# Patient Record
Sex: Female | Born: 1990 | Race: Black or African American | Hispanic: No | Marital: Single | Smoking: Never smoker
Health system: Southern US, Community
[De-identification: ages and names within clinical notes are randomized; demographics above are authoritative.]

## PROBLEM LIST (undated history)

## (undated) DIAGNOSIS — D649 Anemia, unspecified: Secondary | ICD-10-CM

## (undated) HISTORY — DX: Anemia, unspecified: D64.9

## (undated) HISTORY — PX: NO PAST SURGERIES: SHX2092

---
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
## 2009-05-21 ENCOUNTER — Other Ambulatory Visit: Admission: RE | Admit: 2009-05-21 | Discharge: 2009-05-21 | Payer: Self-pay | Admitting: Family Medicine

## 2010-06-05 ENCOUNTER — Other Ambulatory Visit
Admission: RE | Admit: 2010-06-05 | Discharge: 2010-06-05 | Payer: Self-pay | Source: Home / Self Care | Admitting: Family Medicine

## 2010-11-29 ENCOUNTER — Inpatient Hospital Stay (INDEPENDENT_AMBULATORY_CARE_PROVIDER_SITE_OTHER)
Admission: RE | Admit: 2010-11-29 | Discharge: 2010-11-29 | Disposition: A | Discharge status: Home / Self Care | Payer: BC Managed Care – PPO | Source: Ambulatory Visit | Attending: Emergency Medicine | Admitting: Emergency Medicine

## 2010-11-29 DIAGNOSIS — J029 Acute pharyngitis, unspecified: Secondary | ICD-10-CM

## 2010-11-29 DIAGNOSIS — R509 Fever, unspecified: Secondary | ICD-10-CM

## 2011-01-19 ENCOUNTER — Other Ambulatory Visit (HOSPITAL_COMMUNITY)
Admission: RE | Admit: 2011-01-19 | Discharge: 2011-01-19 | Disposition: A | Discharge status: Home / Self Care | Payer: BC Managed Care – PPO | Source: Ambulatory Visit | Attending: Obstetrics and Gynecology | Admitting: Obstetrics and Gynecology

## 2011-01-19 DIAGNOSIS — N760 Acute vaginitis: Secondary | ICD-10-CM | POA: Insufficient documentation

## 2011-01-19 DIAGNOSIS — Z124 Encounter for screening for malignant neoplasm of cervix: Secondary | ICD-10-CM | POA: Insufficient documentation

## 2011-01-19 DIAGNOSIS — Z113 Encounter for screening for infections with a predominantly sexual mode of transmission: Secondary | ICD-10-CM | POA: Insufficient documentation

## 2012-09-09 ENCOUNTER — Emergency Department (INDEPENDENT_AMBULATORY_CARE_PROVIDER_SITE_OTHER)
Admission: EM | Admit: 2012-09-09 | Discharge: 2012-09-09 | Disposition: A | Discharge status: Home / Self Care | Payer: PRIVATE HEALTH INSURANCE | Source: Home / Self Care | Attending: Emergency Medicine | Admitting: Emergency Medicine

## 2012-09-09 ENCOUNTER — Encounter (HOSPITAL_COMMUNITY): Payer: Self-pay | Admitting: *Deleted

## 2012-09-09 DIAGNOSIS — N912 Amenorrhea, unspecified: Secondary | ICD-10-CM

## 2012-09-09 LAB — POCT URINALYSIS DIP (DEVICE)
Protein, ur: 30 mg/dL — AB
Specific Gravity, Urine: >=1.03 (ref 1.005–1.030)
pH: 6 (ref 5.0–8.0)

## 2012-09-09 NOTE — ED Notes (Signed)
Pt  Not  Having  Any  Symptoms  Other than  Being  Late  On  Her  Period      She  Wants  To  Be  Checked  For  Pregnancy

## 2012-09-09 NOTE — ED Provider Notes (Signed)
History     CSN: [id number]  Arrival date & time 09/09/12  1543   First MD Initiated Contact with Patient 09/09/12 1543      Chief Complaint  Patient presents with  . Possible Pregnancy    (Consider location/radiation/quality/duration/timing/severity/associated sxs/prior treatment) HPI Comments: Patient presents urgent care describing that she's been late for her period for 5 days. Describes that she tends to be irregular at times. Denies any nausea vomiting pelvic pain or vaginal bleeding denies any urinary symptoms. Currently his sexually active but is not using protection consistently.  The history is provided by the patient.    History reviewed. No pertinent past medical history.  History reviewed. No pertinent past surgical history.  History reviewed. No pertinent family history.  History  Substance Use Topics  . Smoking status: Not on file  . Smokeless tobacco: Not on file  . Alcohol Use: No    OB History   Grav Para Term Preterm Abortions TAB SAB Ect Mult Living                  Review of Systems  Constitutional: Negative for fever, chills, activity change and appetite change.  Gastrointestinal: Negative for abdominal pain.  Genitourinary: Positive for menstrual problem. Negative for dysuria, urgency, frequency, hematuria, flank pain, vaginal bleeding, vaginal discharge, difficulty urinating, genital sores, pelvic pain and dyspareunia.  Musculoskeletal: Negative for arthralgias.  Skin: Negative for pallor and rash.    Allergies  Review of patient's allergies indicates no known allergies.  Home Medications  No current outpatient prescriptions on file.  BP 136/82  Pulse 82  Temp(Src) 100.2 F (37.9 C) (Oral)  Resp 16  SpO2 97%  LMP 08/08/2012  Physical Exam  Constitutional: She appears well-developed and well-nourished. No distress.  Abdominal: Soft. There is no tenderness.  Skin: No rash noted. No erythema. No pallor.    ED Course  Procedures  (including critical care time)  Labs Reviewed  POCT URINALYSIS DIP (DEVICE) - Abnormal; Notable for the following:    Ketones, ur TRACE (*)    Hgb urine dipstick MODERATE (*)    Protein, ur 30 (*)    Leukocytes, UA TRACE (*)    All other components within normal limits  POCT PREGNANCY, URINE   No results found.   1. Amenorrhea       MDM   Amenorrhea- no pelvic pain. Negative pregnancy test. Advise patient to repeat test in 5-7 days if she continues with [place] in her period. Patient is basically asymptomatic at this point.      Jimmie Molly, MD 09/09/12 1710

## 2014-01-11 ENCOUNTER — Encounter (HOSPITAL_COMMUNITY): Payer: Self-pay | Admitting: Emergency Medicine

## 2014-01-11 ENCOUNTER — Emergency Department (HOSPITAL_COMMUNITY)
Admission: EM | Admit: 2014-01-11 | Discharge: 2014-01-11 | Disposition: A | Discharge status: Home / Self Care | Payer: PRIVATE HEALTH INSURANCE | Attending: Emergency Medicine | Admitting: Emergency Medicine

## 2014-01-11 DIAGNOSIS — Z3202 Encounter for pregnancy test, result negative: Secondary | ICD-10-CM | POA: Insufficient documentation

## 2014-01-11 DIAGNOSIS — N760 Acute vaginitis: Secondary | ICD-10-CM | POA: Insufficient documentation

## 2014-01-11 DIAGNOSIS — R109 Unspecified abdominal pain: Secondary | ICD-10-CM | POA: Insufficient documentation

## 2014-01-11 DIAGNOSIS — B9689 Other specified bacterial agents as the cause of diseases classified elsewhere: Secondary | ICD-10-CM | POA: Insufficient documentation

## 2014-01-11 DIAGNOSIS — A499 Bacterial infection, unspecified: Secondary | ICD-10-CM | POA: Insufficient documentation

## 2014-01-11 LAB — URINE MICROSCOPIC-ADD ON

## 2014-01-11 LAB — URINALYSIS, ROUTINE W REFLEX MICROSCOPIC
Bilirubin Urine: NEGATIVE
Glucose, UA: NEGATIVE mg/dL
KETONES UR: NEGATIVE mg/dL
LEUKOCYTES UA: NEGATIVE
NITRITE: NEGATIVE
PROTEIN: NEGATIVE mg/dL
Specific Gravity, Urine: 1.024 (ref 1.005–1.030)
UROBILINOGEN UA: 1 mg/dL (ref 0.0–1.0)
pH: 6 (ref 5.0–8.0)

## 2014-01-11 LAB — PREGNANCY, URINE: Preg Test, Ur: NEGATIVE

## 2014-01-11 LAB — WET PREP, GENITAL
TRICH WET PREP: NONE SEEN
Yeast Wet Prep HPF POC: NONE SEEN

## 2014-01-11 MED ORDER — METRONIDAZOLE 500 MG PO TABS: 500.0000 mg | ORAL_TABLET | Freq: Two times a day (BID) | ORAL | Status: DC

## 2014-01-11 NOTE — ED Notes (Signed)
Pt in c/o lower abd pain and frequent urination over the last few days, denies fever, denies other symptoms. No distress noted.

## 2014-01-11 NOTE — Discharge Instructions (Signed)
Bacterial Vaginosis Bacterial vaginosis is a vaginal infection that occurs when the normal balance of bacteria in the vagina is disrupted. It results from an overgrowth of certain bacteria. This is the most common vaginal infection in women of childbearing age. Treatment is important to prevent complications, especially in pregnant women, as it can cause a premature delivery. CAUSES  Bacterial vaginosis is caused by an increase in harmful bacteria that are normally present in smaller amounts in the vagina. Several different kinds of bacteria can cause bacterial vaginosis. However, the reason that the condition develops is not fully understood. RISK FACTORS Certain activities or behaviors can put you at an increased risk of developing bacterial vaginosis, including:  Having a new sex partner or multiple sex partners.  Douching.  Using an intrauterine device (IUD) for contraception. Women do not get bacterial vaginosis from toilet seats, bedding, swimming pools, or contact with objects around them. SIGNS AND SYMPTOMS  Some women with bacterial vaginosis have no signs or symptoms. Common symptoms include:  Grey vaginal discharge.  A fishlike odor with discharge, especially after sexual intercourse.  Itching or burning of the vagina and vulva.  Burning or pain with urination. DIAGNOSIS  Your health care provider will take a medical history and examine the vagina for signs of bacterial vaginosis. A sample of vaginal fluid may be taken. Your health care provider will look at this sample under a microscope to check for bacteria and abnormal cells. A vaginal pH test may also be done.  TREATMENT  Bacterial vaginosis may be treated with antibiotic medicines. These may be given in the form of a pill or a vaginal cream. A second round of antibiotics may be prescribed if the condition comes back after treatment.  HOME CARE INSTRUCTIONS   Only take over-the-counter or prescription medicines as  directed by your health care provider.  If antibiotic medicine was prescribed, take it as directed. Make sure you finish it even if you start to feel better.  Do not have sex until treatment is completed.  Tell all sexual partners that you have a vaginal infection. They should see their health care provider and be treated if they have problems, such as a mild rash or itching.  Practice safe sex by using condoms and only having one sex partner. SEEK MEDICAL CARE IF:   Your symptoms are not improving after 3 days of treatment.  You have increased discharge or pain.  You have a fever. MAKE SURE YOU:   Understand these instructions.  Will watch your condition.  Will get help right away if you are not doing well or get worse. FOR MORE INFORMATION  Centers for Disease Control and Prevention, Division of STD Prevention: www.cdc.gov/std American Sexual Health Association (ASHA): www.ashastd.org  Document Released: 06/07/2005 Document Revised: 03/28/2013 Document Reviewed: 01/17/2013 ExitCare Patient Information 2015 [employer]. This information is not intended to replace advice given to you by your health care provider. Make sure you discuss any questions you have with your health care provider.  

## 2014-01-11 NOTE — ED Provider Notes (Signed)
CSN: [id number]     Arrival date & time 01/11/14  1602 History   First MD Initiated Contact with Patient 01/11/14 1917     Chief Complaint  Patient presents with  . Urinary Frequency  . Abdominal Pain     (Consider location/radiation/quality/duration/timing/severity/associated sxs/prior Treatment) HPI Comments: Patient presents to the emergency department with chief complaint of urinary frequency, as well as lower abdominal discomfort. She denies any fevers, chills, nausea, vomiting, diarrhea, or constipation. Denies any vaginal discharge. She is tried taking ibuprofen with some relief. She denies any dysuria, or hematuria. She states it is possible she could be pregnant.  The history is provided by the patient. No language interpreter was used.    History reviewed. No pertinent past medical history. History reviewed. No pertinent past surgical history. History reviewed. No pertinent family history. History  Substance Use Topics  . Smoking status: Not on file  . Smokeless tobacco: Not on file  . Alcohol Use: No   OB History   Grav Para Term Preterm Abortions TAB SAB Ect Mult Living                 Review of Systems  Gastrointestinal: Positive for abdominal pain.  Genitourinary: Positive for frequency. Negative for dysuria and hematuria.  All other systems reviewed and are negative.     Allergies  Review of patient's allergies indicates no known allergies.  Home Medications   Prior to Admission medications   Not on File   BP 135/82  Pulse 83  Temp(Src) 98.6 F (37 C) (Oral)  Resp 18  SpO2 99% Physical Exam  Nursing note and vitals reviewed. Constitutional: She is oriented to person, place, and time. She appears well-developed and well-nourished.  HENT:  Head: Normocephalic and atraumatic.  Eyes: Conjunctivae and EOM are normal. Pupils are equal, round, and reactive to light.  Neck: Normal range of motion. Neck supple.  Cardiovascular: Normal rate and regular  rhythm.  Exam reveals no gallop and no friction rub.   No murmur heard. Pulmonary/Chest: Effort normal and breath sounds normal. No respiratory distress. She has no wheezes. She has no rales. She exhibits no tenderness.  Abdominal: Soft. Bowel sounds are normal. She exhibits no distension and no mass. There is no tenderness. There is no rebound and no guarding.  No focal abdominal tenderness, no RLQ tenderness or pain at McBurney's point, no RUQ tenderness or Murphy's sign, no left-sided abdominal tenderness, no fluid wave, or signs of peritonitis   Genitourinary:  Pelvic exam chaperoned by female ER tech, no right or left adnexal tenderness, no uterine tenderness, mild brown vaginal discharge, no bleeding, no CMT or friability, no foreign body, no injury to the external genitalia, no other significant findings   Musculoskeletal: Normal range of motion. She exhibits no edema and no tenderness.  Neurological: She is alert and oriented to person, place, and time.  Skin: Skin is warm and dry.  Psychiatric: She has a normal mood and affect. Her behavior is normal. Judgment and thought content normal.    ED Course  Procedures (including critical care time) Results for orders placed during the hospital encounter of 01/11/14  WET PREP, GENITAL      Result Value Ref Range   Yeast Wet Prep HPF POC NONE SEEN  NONE SEEN   Trich, Wet Prep NONE SEEN  NONE SEEN   Clue Cells Wet Prep HPF POC MANY (*) NONE SEEN   WBC, Wet Prep HPF POC FEW (*) NONE SEEN  URINALYSIS,  ROUTINE W REFLEX MICROSCOPIC      Result Value Ref Range   Color, Urine YELLOW  YELLOW   APPearance CLOUDY (*) CLEAR   Specific Gravity, Urine 1.024  1.005 - 1.030   pH 6.0  5.0 - 8.0   Glucose, UA NEGATIVE  NEGATIVE mg/dL   Hgb urine dipstick LARGE (*) NEGATIVE   Bilirubin Urine NEGATIVE  NEGATIVE   Ketones, ur NEGATIVE  NEGATIVE mg/dL   Protein, ur NEGATIVE  NEGATIVE mg/dL   Urobilinogen, UA 1.0  0.0 - 1.0 mg/dL   Nitrite NEGATIVE   NEGATIVE   Leukocytes, UA NEGATIVE  NEGATIVE  PREGNANCY, URINE      Result Value Ref Range   Preg Test, Ur NEGATIVE  NEGATIVE  URINE MICROSCOPIC-ADD ON      Result Value Ref Range   Squamous Epithelial / LPF MANY (*) RARE   WBC, UA 3-6  <3 WBC/hpf   RBC / HPF 11-20  <3 RBC/hpf   Bacteria, UA MANY (*) RARE   No results found.   Imaging Review No results found.   EKG Interpretation None      MDM   Final diagnoses:  BV (bacterial vaginosis)    Patient with urinary frequency. She is concerned about UTI. She also complains of abdominal pain, however she does not have any abdominal tenderness. Doubt acute or surgical abdomen. No fevers. No nausea, vomiting, diarrhea, constipation. No vaginal discharge. Will check a urinalysis, and reassess.  8:30 PM Doubt UTI based on urinalysis, will proceed with pelvic exam, and wet prep.  Pelvic exam was remarkable for some brown discharge, no adnexal or uterine tenderness.  10:08 PM Wet prep was remarkable for many clue cells. Will treat for BV with metronidazole. Discharge to home. Patient understands and agrees with plan. She is stable and ready for discharge.  Discussed with Dr. Ashok Cordia, who agrees with the plan.    Montine Circle, PA-C 01/11/14 2209

## 2014-01-11 NOTE — ED Provider Notes (Signed)
Medical screening examination/treatment/procedure(s) were conducted as a shared visit with non-physician practitioner(s) and myself.  I personally evaluated the patient during the encounter.  Pt c/o urinary urgency, frequency. No abd or flank pain. abd soft nt. Labs.    Mirna Mires, MD 01/11/14 [phone]

## 2014-01-14 LAB — GC/CHLAMYDIA PROBE AMP
CT PROBE, AMP APTIMA: NEGATIVE
GC PROBE AMP APTIMA: NEGATIVE

## 2016-06-10 ENCOUNTER — Emergency Department (HOSPITAL_COMMUNITY)
Admission: EM | Admit: 2016-06-10 | Discharge: 2016-06-10 | Disposition: A | Discharge status: Home / Self Care | Payer: No Typology Code available for payment source | Attending: Emergency Medicine | Admitting: Emergency Medicine

## 2016-06-10 ENCOUNTER — Encounter (HOSPITAL_COMMUNITY): Payer: Self-pay

## 2016-06-10 DIAGNOSIS — Y999 Unspecified external cause status: Secondary | ICD-10-CM | POA: Diagnosis not present

## 2016-06-10 DIAGNOSIS — Y939 Activity, unspecified: Secondary | ICD-10-CM | POA: Diagnosis not present

## 2016-06-10 DIAGNOSIS — Y92410 Unspecified street and highway as the place of occurrence of the external cause: Secondary | ICD-10-CM | POA: Diagnosis not present

## 2016-06-10 DIAGNOSIS — S86811A Strain of other muscle(s) and tendon(s) at lower leg level, right leg, initial encounter: Secondary | ICD-10-CM

## 2016-06-10 DIAGNOSIS — S8991XA Unspecified injury of right lower leg, initial encounter: Secondary | ICD-10-CM | POA: Diagnosis present

## 2016-06-10 DIAGNOSIS — S86819A Strain of other muscle(s) and tendon(s) at lower leg level, unspecified leg, initial encounter: Secondary | ICD-10-CM | POA: Diagnosis not present

## 2016-06-10 MED ORDER — METHOCARBAMOL 500 MG PO TABS: 1000.0000 mg | ORAL_TABLET | Freq: Four times a day (QID) | ORAL | 0 refills | Status: DC

## 2016-06-10 MED ORDER — NAPROXEN 500 MG PO TABS
500.0000 mg | ORAL_TABLET | Freq: Once | ORAL | Status: AC
  Administered 2016-06-10: 500 mg via ORAL
  Filled 2016-06-10: qty 1

## 2016-06-10 MED ORDER — NAPROXEN 500 MG PO TABS: 500.0000 mg | ORAL_TABLET | Freq: Two times a day (BID) | ORAL | 0 refills | Status: DC
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
## 2020-10-24 ENCOUNTER — Encounter: Payer: Self-pay | Admitting: Podiatry

## 2020-10-24 ENCOUNTER — Ambulatory Visit (INDEPENDENT_AMBULATORY_CARE_PROVIDER_SITE_OTHER): Payer: Managed Care, Other (non HMO)

## 2020-10-24 ENCOUNTER — Other Ambulatory Visit: Payer: Self-pay | Admitting: Podiatry

## 2020-10-24 ENCOUNTER — Ambulatory Visit (INDEPENDENT_AMBULATORY_CARE_PROVIDER_SITE_OTHER): Payer: Managed Care, Other (non HMO) | Admitting: Podiatry

## 2020-10-24 ENCOUNTER — Other Ambulatory Visit: Payer: Self-pay

## 2020-10-24 DIAGNOSIS — M67471 Ganglion, right ankle and foot: Secondary | ICD-10-CM

## 2020-10-24 DIAGNOSIS — M79671 Pain in right foot: Secondary | ICD-10-CM

## 2020-10-24 DIAGNOSIS — R2241 Localized swelling, mass and lump, right lower limb: Secondary | ICD-10-CM

## 2020-10-24 DIAGNOSIS — M6740 Ganglion, unspecified site: Secondary | ICD-10-CM | POA: Diagnosis not present

## 2020-10-24 NOTE — Progress Notes (Signed)
  Subjective:  Patient ID: Joanna Hull, female    DOB: 01-06-1991,  MRN: [id number]  Chief Complaint  Patient presents with  . Foot Pain    Right foot posterior heel/ankle painful lump. Pt states noticed it 4 days ago. Denies any known injuries.   30 y.o. female presents with the above complaint. History confirmed with patient. Denies known injury.  Objective:  Physical Exam: warm, good capillary refill, no trophic changes or ulcerative lesions, normal DP and PT pulses and normal sensory exam. 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  Assessment:   1. Ankle mass, right   2. Ganglion cyst    Plan:  Patient was evaluated and treated and all questions answered.  Mass right ankle, possible ganglion; unlikely partial Achilles tear -XR reviewed. 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 -If this is fluid filled will consider aspiration next visit.  Return in about 2 weeks (around 11/07/2020) for Ultrasound review.

## 2020-11-11 ENCOUNTER — Ambulatory Visit: Payer: Managed Care, Other (non HMO) | Admitting: Podiatry

## 2020-11-19 ENCOUNTER — Ambulatory Visit
Admission: RE | Admit: 2020-11-19 | Discharge: 2020-11-19 | Disposition: A | Discharge status: Home / Self Care | Payer: PRIVATE HEALTH INSURANCE | Source: Ambulatory Visit | Attending: Podiatry | Admitting: Podiatry

## 2020-11-19 DIAGNOSIS — M6740 Ganglion, unspecified site: Secondary | ICD-10-CM

## 2020-11-19 NOTE — Addendum Note (Signed)
Addended by: Hardie Pulley on: 11/19/2020 02:53 PM   Modules accepted: Orders

## 2020-11-21 ENCOUNTER — Ambulatory Visit: Payer: Managed Care, Other (non HMO) | Admitting: Podiatry

## 2020-11-25 ENCOUNTER — Ambulatory Visit (INDEPENDENT_AMBULATORY_CARE_PROVIDER_SITE_OTHER): Payer: Managed Care, Other (non HMO) | Admitting: Podiatry

## 2020-11-25 ENCOUNTER — Other Ambulatory Visit: Payer: Self-pay

## 2020-11-25 DIAGNOSIS — L720 Epidermal cyst: Secondary | ICD-10-CM

## 2020-11-25 DIAGNOSIS — D3610 Benign neoplasm of peripheral nerves and autonomic nervous system, unspecified: Secondary | ICD-10-CM

## 2020-11-25 DIAGNOSIS — R2241 Localized swelling, mass and lump, right lower limb: Secondary | ICD-10-CM

## 2020-11-25 NOTE — Progress Notes (Signed)
  Subjective:  Patient ID: Joanna Hull, female    DOB: 1991/02/01,  MRN: [id number]  Chief Complaint  Patient presents with  . Consult    [place] review only today from 11/19/20.    30 y.o. female presents with the above complaint. History confirmed with patient. Denies known injury.  Objective:  Physical Exam: warm, good capillary refill, no trophic changes or ulcerative lesions, normal DP and PT pulses and normal sensory exam. 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  EXAM: ULTRASOUND RIGHT LOWER EXTREMITY LIMITED  TECHNIQUE: Ultrasound examination of the lower extremity soft tissues was performed in the area of clinical concern.  COMPARISON:  None.  FINDINGS: Focused ultrasound of the posterior right ankle in the region of the palpable abnormality demonstrates an oval, well-circumscribed, superficial, relatively homogeneous hypoechoic mass with posterior acoustic enhancement. The mass measures approximately 8 x 9 x 7 mm. There is no significant internal vascularity.  IMPRESSION: 1. 9 mm complex mass in the region of palpable abnormality. Appearance is nonspecific. Differential considerations include epidermal inclusion cyst and peripheral nerve sheath tumor. Recommend MRI of the right ankle with and without contrast for further evaluation. Assessment:   1. Ankle mass, right   2. Epidermal inclusion cyst   3. Schwannoma    Plan:  Patient was evaluated and treated and all questions answered.  Mass right ankle, possible ganglion; unlikely partial Achilles tear -[place] reviewed with patient.  -Discussed ordering MRI to better characterize mass -Will likely need removal -MRI order previously placed, pending.  No follow-ups on file.

## 2020-12-03 ENCOUNTER — Ambulatory Visit
Admission: RE | Admit: 2020-12-03 | Discharge: 2020-12-03 | Disposition: A | Discharge status: Home / Self Care | Payer: PRIVATE HEALTH INSURANCE | Source: Ambulatory Visit | Attending: Podiatry | Admitting: Podiatry

## 2020-12-03 ENCOUNTER — Other Ambulatory Visit: Payer: Self-pay

## 2020-12-03 DIAGNOSIS — R2241 Localized swelling, mass and lump, right lower limb: Secondary | ICD-10-CM

## 2020-12-03 DIAGNOSIS — M6740 Ganglion, unspecified site: Secondary | ICD-10-CM

## 2020-12-03 MED ORDER — GADOBENATE DIMEGLUMINE 529 MG/ML IV SOLN
20.0000 mL | Freq: Once | INTRAVENOUS | Status: AC | PRN
  Administered 2020-12-03: 20 mL via INTRAVENOUS

## 2020-12-16 ENCOUNTER — Telehealth: Payer: Self-pay | Admitting: Podiatry

## 2020-12-16 NOTE — Telephone Encounter (Signed)
Attempted to call patient to go over MRI Results. No answer. VM was left. Advised patient to call for an appt and we can discuss next steps, most likely removal

## 2020-12-19 ENCOUNTER — Ambulatory Visit: Payer: Managed Care, Other (non HMO) | Admitting: Podiatry

## 2021-01-02 ENCOUNTER — Ambulatory Visit: Payer: Managed Care, Other (non HMO) | Admitting: Podiatry

## 2022-04-26 ENCOUNTER — Emergency Department (HOSPITAL_COMMUNITY)
Admission: EM | Admit: 2022-04-26 | Discharge: 2022-04-26 | Disposition: A | Discharge status: Home / Self Care | Payer: Managed Care, Other (non HMO) | Attending: Emergency Medicine | Admitting: Emergency Medicine

## 2022-04-26 ENCOUNTER — Emergency Department (HOSPITAL_COMMUNITY): Payer: Managed Care, Other (non HMO)

## 2022-04-26 ENCOUNTER — Encounter (HOSPITAL_COMMUNITY): Payer: Self-pay | Admitting: Emergency Medicine

## 2022-04-26 ENCOUNTER — Other Ambulatory Visit: Payer: Self-pay

## 2022-04-26 DIAGNOSIS — R7309 Other abnormal glucose: Secondary | ICD-10-CM | POA: Diagnosis not present

## 2022-04-26 DIAGNOSIS — D509 Iron deficiency anemia, unspecified: Secondary | ICD-10-CM | POA: Insufficient documentation

## 2022-04-26 DIAGNOSIS — N852 Hypertrophy of uterus: Secondary | ICD-10-CM | POA: Insufficient documentation

## 2022-04-26 DIAGNOSIS — R109 Unspecified abdominal pain: Secondary | ICD-10-CM | POA: Diagnosis present

## 2022-04-26 DIAGNOSIS — R1030 Lower abdominal pain, unspecified: Secondary | ICD-10-CM

## 2022-04-26 LAB — CBC WITH DIFFERENTIAL/PLATELET
Abs Immature Granulocytes: 0.01 10*3/uL (ref 0.00–0.07)
Basophils Absolute: 0.1 10*3/uL (ref 0.0–0.1)
Basophils Relative: 1 %
Eosinophils Absolute: 0.3 10*3/uL (ref 0.0–0.5)
Eosinophils Relative: 5 %
HCT: 28 % — ABNORMAL LOW (ref 36.0–46.0)
Hemoglobin: 7.3 g/dL — ABNORMAL LOW (ref 12.0–15.0)
Immature Granulocytes: 0 %
Lymphocytes Relative: 34 %
Lymphs Abs: 2.4 10*3/uL (ref 0.7–4.0)
MCH: 17.2 pg — ABNORMAL LOW (ref 26.0–34.0)
MCHC: 26.1 g/dL — ABNORMAL LOW (ref 30.0–36.0)
MCV: 66 fL — ABNORMAL LOW (ref 80.0–100.0)
Monocytes Absolute: 0.4 10*3/uL (ref 0.1–1.0)
Monocytes Relative: 6 %
Neutro Abs: 3.8 10*3/uL (ref 1.7–7.7)
Neutrophils Relative %: 54 %
Platelets: 373 10*3/uL (ref 150–400)
RBC: 4.24 MIL/uL (ref 3.87–5.11)
RDW: 21.9 % — ABNORMAL HIGH (ref 11.5–15.5)
WBC: 7 10*3/uL (ref 4.0–10.5)
nRBC: 0 % (ref 0.0–0.2)

## 2022-04-26 LAB — I-STAT BETA HCG BLOOD, ED (MC, WL, AP ONLY): I-stat hCG, quantitative: <5 m[IU]/mL (ref <5)

## 2022-04-26 LAB — URINALYSIS, ROUTINE W REFLEX MICROSCOPIC
Bacteria, UA: NONE SEEN
Bilirubin Urine: NEGATIVE
Glucose, UA: NEGATIVE mg/dL
Ketones, ur: NEGATIVE mg/dL
Leukocytes,Ua: NEGATIVE
Nitrite: NEGATIVE
Protein, ur: NEGATIVE mg/dL
Specific Gravity, Urine: >1.046 — ABNORMAL HIGH (ref 1.005–1.030)
pH: 6 (ref 5.0–8.0)

## 2022-04-26 LAB — COMPREHENSIVE METABOLIC PANEL
ALT: 20 U/L (ref 0–44)
AST: 42 U/L — ABNORMAL HIGH (ref 15–41)
Albumin: 3.7 g/dL (ref 3.5–5.0)
Alkaline Phosphatase: 54 U/L (ref 38–126)
Anion gap: 4 — ABNORMAL LOW (ref 5–15)
BUN: 9 mg/dL (ref 6–20)
CO2: 23 mmol/L (ref 22–32)
Calcium: 8.7 mg/dL — ABNORMAL LOW (ref 8.9–10.3)
Chloride: 109 mmol/L (ref 98–111)
Creatinine, Ser: 0.62 mg/dL (ref 0.44–1.00)
GFR, Estimated: >60 mL/min (ref >60)
Glucose, Bld: 109 mg/dL — ABNORMAL HIGH (ref 70–99)
Potassium: 4.2 mmol/L (ref 3.5–5.1)
Sodium: 136 mmol/L (ref 135–145)
Total Bilirubin: 0.5 mg/dL (ref 0.3–1.2)
Total Protein: 7.6 g/dL (ref 6.5–8.1)

## 2022-04-26 LAB — LIPASE, BLOOD: Lipase: 27 U/L (ref 11–51)

## 2022-04-26 MED ORDER — IOHEXOL 300 MG/ML  SOLN
100.0000 mL | Freq: Once | INTRAMUSCULAR | Status: AC | PRN
  Administered 2022-04-26: 100 mL via INTRAVENOUS

## 2022-04-26 MED ORDER — IOHEXOL 9 MG/ML PO SOLN
ORAL | Status: AC
  Administered 2022-04-26: 500 mL
  Filled 2022-04-26: qty 1000

## 2022-04-26 NOTE — Discharge Instructions (Signed)
Take acetaminophen and/or ibuprofen as needed for pain.  When you combine acetaminophen and ibuprofen, you get better pain relief and you get from taking either medication by itself.  Return if pain is getting worse and is not adequately controlled with combined ibuprofen and acetaminophen.  Please follow-up with the women's clinic to evaluate your enlarged uterus.  Please follow-up with the cancer center to see if you would benefit from iron infusions.

## 2022-04-26 NOTE — ED Provider Notes (Signed)
[facility] DEPT Provider Note   CSN: [id number] Arrival date & time: 04/26/22  0145     History  Chief Complaint  Patient presents with   Abdominal Pain   Back Pain    Joanna Hull is a 31 y.o. female.  The history is provided by the patient.  Abdominal Pain Back Pain Associated symptoms: abdominal pain   She had onset 3 days ago midline lower back pain.  She has been taking ibuprofen with only slight relief of pain.  Yesterday, she started having pain across her lower abdomen in addition to the back pain.  She denies nausea or vomiting.  She had thought she was constipated when her back pain started and she did take a dose of milk of magnesia and had a bowel movement without any change in her symptoms.  She denies any urinary urgency, frequency, tenesmus, dysuria.  She denies any vaginal discharge.  Last menses was 04/14/2022 and was normal.  She is not using any contraception.   Home Medications Prior to Admission medications   Medication Sig Start Date End Date Taking? Authorizing Provider  methocarbamol (ROBAXIN) 500 MG tablet Take 2 tablets (1,000 mg total) by mouth 4 (four) times daily. Patient not taking: Reported on 10/24/2020 06/10/16   Carlisle Cater, PA-C  metroNIDAZOLE (FLAGYL) 500 MG tablet Take 1 tablet (500 mg total) by mouth 2 (two) times daily. Patient not taking: Reported on 10/24/2020 01/11/14   Montine Circle, PA-C  naproxen (NAPROSYN) 500 MG tablet Take 1 tablet (500 mg total) by mouth 2 (two) times daily. Patient not taking: Reported on 10/24/2020 06/10/16   Carlisle Cater, PA-C      Allergies    Patient has no known allergies.    Review of Systems   Review of Systems  Gastrointestinal:  Positive for abdominal pain.  Musculoskeletal:  Positive for back pain.  All other systems reviewed and are negative.   Physical Exam Updated Vital Signs BP (!) 179/100   Pulse 79   Temp 97.6 F (36.4 C)   Resp 18   Ht '5\' 1"'$  (1.549 m)    Wt 95.3 kg   LMP 04/14/2022   SpO2 100%   BMI 39.68 kg/m  Physical Exam Vitals and nursing note reviewed.   31 year old female, resting comfortably and in no acute distress. Vital signs are significant for elevated blood pressure. Oxygen saturation is 100%, which is normal. Head is normocephalic and atraumatic. PERRLA, EOMI. Oropharynx is clear. Neck is nontender and supple without adenopathy or JVD. Back is tender in the lower lumbar spine.  There is no CVA tenderness. Lungs are clear without rales, wheezes, or rhonchi. Chest is nontender. Heart has regular rate and rhythm without murmur. Abdomen is soft, flat, across the suprapubic area.  There is no rebound or guarding. Extremities have no cyanosis or edema, full range of motion is present. Skin is warm and dry without rash. Neurologic: Mental status is normal, cranial nerves are intact, moves all extremities equally.  ED Results / Procedures / Treatments   Labs (all labs ordered are listed, but only abnormal results are displayed) Labs Reviewed  COMPREHENSIVE METABOLIC PANEL - Abnormal; Notable for the following components:      Result Value   Glucose, Bld 109 (*)    Calcium 8.7 (*)    AST 42 (*)    Anion gap 4 (*)    All other components within normal limits  CBC WITH DIFFERENTIAL/PLATELET - Abnormal; Notable for the  following components:   Hemoglobin 7.3 (*)    HCT 28.0 (*)    MCV 66.0 (*)    MCH 17.2 (*)    MCHC 26.1 (*)    RDW 21.9 (*)    All other components within normal limits  LIPASE, BLOOD  URINALYSIS, ROUTINE W REFLEX MICROSCOPIC  I-STAT BETA HCG BLOOD, ED ([facility], [facility], [facility] ONLY)    Radiology CT ABDOMEN PELVIS W CONTRAST  Result Date: 04/26/2022 CLINICAL DATA:  Lower back pain since Thursday. Left lower quadrant pain since Saturday EXAM: CT ABDOMEN AND PELVIS WITH CONTRAST TECHNIQUE: Multidetector CT imaging of the abdomen and pelvis was performed using the standard protocol following bolus administration of  intravenous contrast. RADIATION DOSE REDUCTION: This exam was performed according to the departmental dose-optimization program which includes automated exposure control, adjustment of the mA and/or kV according to patient size and/or use of iterative reconstruction technique. CONTRAST:  186m OMNIPAQUE IOHEXOL 300 MG/ML  SOLN COMPARISON:  None Available. FINDINGS: Lower chest:  No contributory findings. Hepatobiliary: No focal liver abnormality.No evidence of biliary obstruction or stone. Pancreas: Unremarkable. Spleen: Unremarkable. Adrenals/Urinary Tract: Negative adrenals. No hydronephrosis or stone. Unremarkable bladder. Stomach/Bowel:  No obstruction. No appendicitis. Vascular/Lymphatic: No acute vascular abnormality. No mass or adenopathy. Reproductive:Globular enlargement of the uterus with heterogeneous enhancement of the body, greater thickening of the ventral body although no discrete measurable mass is seen. No history of recent pregnancy. Other: No ascites or pneumoperitoneum. Musculoskeletal: No acute abnormalities. IMPRESSION: 1. No acute finding. 2. Large uterus, assuming no recent delivery favor adenomyosis. Cannot exclude fibroids given asymmetric thickening of the ventral body. Electronically Signed   By: JJorje GuildM.D.   On: 04/26/2022 05:37    Procedures Procedures    Medications Ordered in ED Medications - No data to display  ED Course/ Medical Decision Making/ A&P                           Medical Decision Making Amount and/or Complexity of Data Reviewed Labs: ordered. Radiology: ordered.  Risk Prescription drug management.   Lower back and lower abdominal pain.  Differential diagnosis includes, but is not limited to, diverticulitis, urolithiasis, pyelonephritis, abdominal aortic aneurysm.  I have ordered laboratory work-up of CBC, comprehensive metabolic panel, lipase and I have ordered a urinalysis and pregnancy test.  I have reviewed and interpreted her  laboratory test, and my interpretation is microcytic anemia consistent with iron deficiency anemia, mildly elevated random glucose level, mild elevation of AST which is not felt to be clinically significant.  I have discussed these findings with the patient, she states that she has a known history of anemia and thinks her last hemoglobin was about 6.  I cannot find any prior hemoglobin levels in the  system or in care everywhere.  CT scan shows an enlarged uterus which is felt most likely to be from adenomyosis versus fibroids.  I have independently viewed the images, and agree with the radiologist's interpretation.  Patient states that pain is reasonably well controlled at this point.  I am discharging her with referral to women's outpatient clinic for evaluation of her enlarged uterus, and referring her to the cancer center for consideration for parenteral iron therapy.  Patient has told me that she was told to take oral iron but was not able to tolerate it.  I have also reevaluated patient's abdomen, and tenderness seems to be clearly localized to the enlarged uterus.  Final Clinical  Impression(s) / ED Diagnoses Final diagnoses:  Lower abdominal pain  Enlarged uterus  Microcytic anemia  Elevated random blood glucose level    Rx / DC Orders ED Discharge Orders     None         Delora Fuel, MD 11/94/17 [phone]

## 2022-04-26 NOTE — ED Triage Notes (Signed)
Pt c/o lower back pain since Thursday and new abdominal pain that started Saturday.

## 2022-04-27 ENCOUNTER — Telehealth: Payer: Self-pay | Admitting: Hematology and Oncology

## 2022-04-27 NOTE — Telephone Encounter (Signed)
Scheduled appointment per 11/07 referral. Patient is aware of appointment date and time. Patient is aware to arrive 15 mins prior to appointment time and to bring updated insurance cards. Patient is aware of location.   

## 2022-05-18 ENCOUNTER — Inpatient Hospital Stay: Payer: Managed Care, Other (non HMO) | Attending: Hematology and Oncology | Admitting: Hematology and Oncology

## 2022-05-18 ENCOUNTER — Inpatient Hospital Stay: Payer: Managed Care, Other (non HMO)

## 2022-05-18 VITALS — BP 144/89 | HR 67 | Temp 98.1°F | Resp 16 | Ht 61.0 in | Wt 224.5 lb

## 2022-05-18 DIAGNOSIS — K219 Gastro-esophageal reflux disease without esophagitis: Secondary | ICD-10-CM | POA: Insufficient documentation

## 2022-05-18 DIAGNOSIS — D500 Iron deficiency anemia secondary to blood loss (chronic): Secondary | ICD-10-CM | POA: Diagnosis not present

## 2022-05-18 DIAGNOSIS — N920 Excessive and frequent menstruation with regular cycle: Secondary | ICD-10-CM | POA: Insufficient documentation

## 2022-05-18 DIAGNOSIS — D649 Anemia, unspecified: Secondary | ICD-10-CM | POA: Insufficient documentation

## 2022-05-18 DIAGNOSIS — R110 Nausea: Secondary | ICD-10-CM | POA: Insufficient documentation

## 2022-05-18 NOTE — Progress Notes (Unsigned)
[facility] CONSULT NOTE  Patient Care Team: Pcp, No as PCP - General  CHIEF COMPLAINTS/PURPOSE OF CONSULTATION:  Normocytic normochromic anemia.  ASSESSMENT & PLAN:  This is a very pleasant 31 year old female patient with no significant past medical history referred to hematology for evaluation and recommendations regarding severe iron deficiency anemia.  She most recently was seen in the ER and was found to have a hemoglobin of 7.3 g/dL.  She reports heavy menstruation.  She has been taking oral iron for the past 2 to 3 weeks, tolerating it okay however in the past she had severe GERD, nausea and could not tolerate it.  Physical examination today unremarkable.  She is planning a trip coming up to [place] next week and was wondering if she can receive iron infusion before traveling.   We have discussed the following details about iron infusion.  I think it is reasonable to consider IV iron given the severity of anemia.  There are several formularies of intravenous iron available in the market.  We have discussed about risk of allergic/infusion reactions including potentially life-threatening anaphylaxis with intravenous iron however these serious allergic reactions are exceedingly rare and overestimated.  In contrast to serious allergic reactions, IV iron may be associated with nonallergic infusion reactions including self-limited urticaria, palpitations, dizziness, neck and back spasm which again occur in less than 1% of the individuals and do not progress to more serious reactions.  She is agreeable to trying the iron.  This has been ordered.  She will be scheduled as soon as possible. She will return to clinic in 3 months with repeat labs.  Thank you for consulting [place] the care of this patient.  Please do not hesitate to contact us with any additional questions or concerns.  HISTORY OF PRESENTING ILLNESS:  Joanna Hull 31 y.o. female is here because of severe anemia.   This is  a very pleasant 31 year old female patient with no significant past medical history referred to hematology with severe anemia.  She was recently seen in the emergency room for the same, started on oral iron supplementation which she has been taking for the past 2 to 3 weeks.  In the past when she tried oral iron, she had severe GERD, nausea and she could not tolerate it.  She reports fatigue, severe back pain which was attributed to endometriosis.  She denies any shortness of breath on exertion, chest pain or chest pressure.  She never received any iron infusion in the past.  No family history of hematological disorders.  She attributes this anemia to heavy menstruation.  Menstrual cycles are however regular,, happen every 29 days but heavy. She is nulliparous. Rest of the pertinent 10 point ROS reviewed and negative  REVIEW OF SYSTEMS:   Constitutional: Denies fevers, chills or abnormal night sweats Eyes: Denies blurriness of vision, double vision or watery eyes Ears, nose, mouth, throat, and face: Denies mucositis or sore throat Respiratory: Denies cough, dyspnea or wheezes Cardiovascular: Denies palpitation, chest discomfort or lower extremity swelling Gastrointestinal:  Denies nausea, heartburn or change in bowel habits Skin: Denies abnormal skin rashes Lymphatics: Denies new lymphadenopathy or easy bruising Neurological:Denies numbness, tingling or new weaknesses Behavioral/Psych: Mood is stable, no new changes  All other systems were reviewed with the patient and are negative.  MEDICAL HISTORY:  No past medical history on file.  SURGICAL HISTORY: No past surgical history on file.  SOCIAL HISTORY: Social History   Socioeconomic History   Marital status: Single  Spouse name: Not on file   Number of children: Not on file   Years of education: Not on file   Highest education level: Not on file  Occupational History   Not on file  Tobacco Use   Smoking status: Never   Smokeless  tobacco: Never  Substance and Sexual Activity   Alcohol use: Never   Drug use: Never   Sexual activity: Not on file  Other Topics Concern   Not on file  Social History Narrative   Not on file   Social Determinants of Health   Financial Resource Strain: Not on file  Food Insecurity: Not on file  Transportation Needs: Not on file  Physical Activity: Not on file  Stress: Not on file  Social Connections: Not on file  Intimate Partner Violence: Not on file    FAMILY HISTORY: No family history on file.  ALLERGIES:  has No Known Allergies.  MEDICATIONS:  No current outpatient medications on file.   No current facility-administered medications for this visit.   PHYSICAL EXAMINATION: ECOG PERFORMANCE STATUS: 0 - Asymptomatic  Vitals:   05/18/22 1112  BP: (!) 144/89  Pulse: 67  Resp: 16  Temp: 98.1 F (36.7 C)  SpO2: 100%   Filed Weights   05/18/22 1112  Weight: 224 lb 8 oz (101.8 kg)    GENERAL:alert, no distress and comfortable SKIN: skin color, texture, turgor are normal, no rashes or significant lesions EYES: normal, conjunctiva are pink and non-injected, sclera clear OROPHARYNX:no exudate, no erythema and lips, buccal mucosa, and tongue normal  NECK: supple, thyroid normal size, non-tender, without nodularity LYMPH:  no palpable lymphadenopathy in the cervical, axillary  LUNGS: clear to auscultation and percussion with normal breathing effort HEART: regular rate & rhythm and no murmurs and no lower extremity edema ABDOMEN:abdomen soft, non-tender and normal bowel sounds Musculoskeletal:no cyanosis of digits and no clubbing  PSYCH: alert & oriented x 3 with fluent speech NEURO: no focal motor/sensory deficits  LABORATORY DATA:  I have reviewed the data as listed Lab Results  Component Value Date   WBC 7.0 04/26/2022   HGB 7.3 (L) 04/26/2022   HCT 28.0 (L) 04/26/2022   MCV 66.0 (L) 04/26/2022   PLT 373 04/26/2022     Chemistry      Component Value  Date/Time   NA 136 04/26/2022 0225   K 4.2 04/26/2022 0225   CL 109 04/26/2022 0225   CO2 23 04/26/2022 0225   BUN 9 04/26/2022 0225   CREATININE 0.62 04/26/2022 0225      Component Value Date/Time   CALCIUM 8.7 (L) 04/26/2022 0225   ALKPHOS 54 04/26/2022 0225   AST 42 (H) 04/26/2022 0225   ALT 20 04/26/2022 0225   BILITOT 0.5 04/26/2022 0225       RADIOGRAPHIC STUDIES: I have personally reviewed the radiological images as listed and agreed with the findings in the report. CT ABDOMEN PELVIS W CONTRAST  Result Date: 04/26/2022 CLINICAL DATA:  Lower back pain since Thursday. Left lower quadrant pain since Saturday EXAM: CT ABDOMEN AND PELVIS WITH CONTRAST TECHNIQUE: Multidetector CT imaging of the abdomen and pelvis was performed using the standard protocol following bolus administration of intravenous contrast. RADIATION DOSE REDUCTION: This exam was performed according to the departmental dose-optimization program which includes automated exposure control, adjustment of the mA and/or kV according to patient size and/or use of iterative reconstruction technique. CONTRAST:  135m OMNIPAQUE IOHEXOL 300 MG/ML  SOLN COMPARISON:  None Available. FINDINGS: Lower chest:  No contributory findings. Hepatobiliary: No focal liver abnormality.No evidence of biliary obstruction or stone. Pancreas: Unremarkable. Spleen: Unremarkable. Adrenals/Urinary Tract: Negative adrenals. No hydronephrosis or stone. Unremarkable bladder. Stomach/Bowel:  No obstruction. No appendicitis. Vascular/Lymphatic: No acute vascular abnormality. No mass or adenopathy. Reproductive:Globular enlargement of the uterus with heterogeneous enhancement of the body, greater thickening of the ventral body although no discrete measurable mass is seen. No history of recent pregnancy. Other: No ascites or pneumoperitoneum. Musculoskeletal: No acute abnormalities. IMPRESSION: 1. No acute finding. 2. Large uterus, assuming no recent delivery  favor adenomyosis. Cannot exclude fibroids given asymmetric thickening of the ventral body. Electronically Signed   By: Jorje Guild M.D.   On: 04/26/2022 05:37    All questions were answered. The patient knows to call the clinic with any problems, questions or concerns. I spent 45 minutes in the care of this patient including H and P, review of records, counseling and coordination of care.     Benay Pike, MD 05/18/2022 11:15 AM

## 2022-05-19 ENCOUNTER — Other Ambulatory Visit: Payer: Self-pay

## 2022-05-19 ENCOUNTER — Encounter: Payer: Self-pay | Admitting: Hematology and Oncology

## 2022-05-19 ENCOUNTER — Encounter: Payer: Self-pay | Admitting: Obstetrics and Gynecology

## 2022-05-20 ENCOUNTER — Telehealth: Payer: Self-pay | Admitting: Hematology and Oncology

## 2022-05-20 NOTE — Telephone Encounter (Signed)
Called patient to notify of upcoming appointment times. Left voicemail with appointment information.

## 2022-05-21 ENCOUNTER — Other Ambulatory Visit: Payer: Self-pay | Admitting: Pharmacy Technician

## 2022-05-24 ENCOUNTER — Other Ambulatory Visit: Payer: Self-pay

## 2022-05-24 ENCOUNTER — Inpatient Hospital Stay: Payer: Managed Care, Other (non HMO) | Attending: Hematology and Oncology

## 2022-05-24 VITALS — BP 156/90 | HR 68 | Temp 98.0°F | Resp 18

## 2022-05-24 DIAGNOSIS — D509 Iron deficiency anemia, unspecified: Secondary | ICD-10-CM | POA: Insufficient documentation

## 2022-05-24 DIAGNOSIS — D500 Iron deficiency anemia secondary to blood loss (chronic): Secondary | ICD-10-CM

## 2022-05-24 DIAGNOSIS — Z79899 Other long term (current) drug therapy: Secondary | ICD-10-CM | POA: Diagnosis not present

## 2022-05-24 MED ORDER — SODIUM CHLORIDE 0.9 % IV SOLN: Freq: Once | INTRAVENOUS | Status: AC

## 2022-05-24 MED ORDER — SODIUM CHLORIDE 0.9 % IV SOLN
200.0000 mg | Freq: Once | INTRAVENOUS | Status: AC
  Administered 2022-05-24: 200 mg via INTRAVENOUS
  Filled 2022-05-24: qty 200

## 2022-05-24 NOTE — Patient Instructions (Signed)

## 2022-05-26 ENCOUNTER — Inpatient Hospital Stay: Payer: Managed Care, Other (non HMO)

## 2022-05-26 ENCOUNTER — Other Ambulatory Visit: Payer: Self-pay

## 2022-05-26 VITALS — BP 144/78 | HR 82 | Temp 98.2°F | Resp 18

## 2022-05-26 DIAGNOSIS — D500 Iron deficiency anemia secondary to blood loss (chronic): Secondary | ICD-10-CM

## 2022-05-26 DIAGNOSIS — D509 Iron deficiency anemia, unspecified: Secondary | ICD-10-CM | POA: Diagnosis not present

## 2022-05-26 MED ORDER — SODIUM CHLORIDE 0.9 % IV SOLN
200.0000 mg | Freq: Once | INTRAVENOUS | Status: AC
  Administered 2022-05-26: 200 mg via INTRAVENOUS
  Filled 2022-05-26: qty 200

## 2022-05-26 MED ORDER — SODIUM CHLORIDE 0.9 % IV SOLN: Freq: Once | INTRAVENOUS | Status: AC

## 2022-05-26 NOTE — Progress Notes (Signed)
Patient declined 30 minute post observation for her iron vss upon discharge.

## 2022-05-26 NOTE — Patient Instructions (Signed)

## 2022-05-27 ENCOUNTER — Encounter: Payer: Self-pay | Admitting: Hematology and Oncology

## 2022-05-27 ENCOUNTER — Encounter: Payer: Self-pay | Admitting: Obstetrics and Gynecology

## 2022-05-28 ENCOUNTER — Inpatient Hospital Stay: Payer: Managed Care, Other (non HMO)

## 2022-05-31 ENCOUNTER — Other Ambulatory Visit: Payer: Self-pay

## 2022-05-31 ENCOUNTER — Inpatient Hospital Stay: Payer: Managed Care, Other (non HMO)

## 2022-05-31 VITALS — BP 141/80 | HR 70 | Temp 98.2°F | Resp 18

## 2022-05-31 DIAGNOSIS — D509 Iron deficiency anemia, unspecified: Secondary | ICD-10-CM | POA: Diagnosis not present

## 2022-05-31 DIAGNOSIS — D500 Iron deficiency anemia secondary to blood loss (chronic): Secondary | ICD-10-CM

## 2022-05-31 MED ORDER — SODIUM CHLORIDE 0.9 % IV SOLN: Freq: Once | INTRAVENOUS | Status: AC

## 2022-05-31 MED ORDER — SODIUM CHLORIDE 0.9 % IV SOLN
200.0000 mg | Freq: Once | INTRAVENOUS | Status: AC
  Administered 2022-05-31: 200 mg via INTRAVENOUS
  Filled 2022-05-31: qty 200

## 2022-05-31 NOTE — Patient Instructions (Signed)

## 2022-05-31 NOTE — Progress Notes (Signed)
Pt declined to be observed for 30 minutes post Venofer infusion. Pt tolerated trtmt well w/out incident. VSS at discharge.  Ambulatory to lobby.   

## 2022-06-02 ENCOUNTER — Inpatient Hospital Stay: Payer: Managed Care, Other (non HMO)

## 2022-06-02 ENCOUNTER — Other Ambulatory Visit: Payer: Self-pay

## 2022-06-02 VITALS — BP 126/84 | HR 76 | Temp 98.2°F | Resp 17

## 2022-06-02 DIAGNOSIS — D500 Iron deficiency anemia secondary to blood loss (chronic): Secondary | ICD-10-CM

## 2022-06-02 DIAGNOSIS — D509 Iron deficiency anemia, unspecified: Secondary | ICD-10-CM | POA: Diagnosis not present

## 2022-06-02 MED ORDER — SODIUM CHLORIDE 0.9 % IV SOLN
200.0000 mg | Freq: Once | INTRAVENOUS | Status: AC
  Administered 2022-06-02: 200 mg via INTRAVENOUS
  Filled 2022-06-02: qty 200

## 2022-06-02 MED ORDER — SODIUM CHLORIDE 0.9 % IV SOLN: Freq: Once | INTRAVENOUS | Status: AC

## 2022-06-04 ENCOUNTER — Inpatient Hospital Stay: Payer: Managed Care, Other (non HMO)

## 2022-06-04 ENCOUNTER — Other Ambulatory Visit: Payer: Self-pay

## 2022-06-04 VITALS — BP 143/82 | HR 97 | Temp 98.5°F | Resp 18

## 2022-06-04 DIAGNOSIS — D509 Iron deficiency anemia, unspecified: Secondary | ICD-10-CM | POA: Diagnosis not present

## 2022-06-04 DIAGNOSIS — D500 Iron deficiency anemia secondary to blood loss (chronic): Secondary | ICD-10-CM

## 2022-06-04 MED ORDER — SODIUM CHLORIDE 0.9 % IV SOLN
200.0000 mg | Freq: Once | INTRAVENOUS | Status: AC
  Administered 2022-06-04: 200 mg via INTRAVENOUS
  Filled 2022-06-04: qty 200

## 2022-06-04 MED ORDER — SODIUM CHLORIDE 0.9 % IV SOLN: Freq: Once | INTRAVENOUS | Status: AC

## 2022-06-04 NOTE — Patient Instructions (Signed)

## 2022-06-09 ENCOUNTER — Ambulatory Visit (INDEPENDENT_AMBULATORY_CARE_PROVIDER_SITE_OTHER): Payer: Managed Care, Other (non HMO) | Admitting: Family Medicine

## 2022-06-09 ENCOUNTER — Encounter: Payer: Self-pay | Admitting: Hematology and Oncology

## 2022-06-09 ENCOUNTER — Encounter: Payer: Self-pay | Admitting: Family Medicine

## 2022-06-09 ENCOUNTER — Ambulatory Visit: Payer: PRIVATE HEALTH INSURANCE | Admitting: Family Medicine

## 2022-06-09 ENCOUNTER — Encounter: Payer: Self-pay | Admitting: Obstetrics and Gynecology

## 2022-06-09 VITALS — BP 138/86 | HR 74 | Temp 98.1°F | Resp 16 | Ht 62.0 in | Wt 225.8 lb

## 2022-06-09 DIAGNOSIS — B009 Herpesviral infection, unspecified: Secondary | ICD-10-CM | POA: Insufficient documentation

## 2022-06-09 DIAGNOSIS — Z7689 Persons encountering health services in other specified circumstances: Secondary | ICD-10-CM | POA: Diagnosis not present

## 2022-06-09 DIAGNOSIS — Z6841 Body Mass Index (BMI) 40.0 and over, adult: Secondary | ICD-10-CM

## 2022-06-09 DIAGNOSIS — D500 Iron deficiency anemia secondary to blood loss (chronic): Secondary | ICD-10-CM

## 2022-06-09 NOTE — Progress Notes (Signed)
Patient is here to established care with provider today. Patient has many health concern they would like to discuss with provider today  Care gaps discuss at appointment today  

## 2022-06-09 NOTE — Progress Notes (Signed)
   New Patient Office Visit  Subjective    Patient ID: Joanna Hull, female    DOB: 1990/09/03  Age: 31 y.o. MRN: [id number]  CC:  Chief Complaint  Patient presents with   Establish Care    HPI Joanna Hull presents to establish care. Patient reports that she has had a recent iron infusion secondary to severe anemia most likely 2/2 heavy menses. She is presently with improved sx.    No outpatient encounter medications on file as of 06/09/2022.   No facility-administered encounter medications on file as of 06/09/2022.    No past medical history on file.  No past surgical history on file.  No family history on file.  Social History   Socioeconomic History   Marital status: Single    Spouse name: Not on file   Number of children: Not on file   Years of education: Not on file   Highest education level: Not on file  Occupational History   Not on file  Tobacco Use   Smoking status: Never   Smokeless tobacco: Never  Substance and Sexual Activity   Alcohol use: Never   Drug use: Never   Sexual activity: Not on file  Other Topics Concern   Not on file  Social History Narrative   Not on file   Social Determinants of Health   Financial Resource Strain: Not on file  Food Insecurity: Not on file  Transportation Needs: Not on file  Physical Activity: Not on file  Stress: Not on file  Social Connections: Not on file  Intimate Partner Violence: Not on file    Review of Systems  All other systems reviewed and are negative.       Objective    BP 138/86   Pulse 74   Temp 98.1 F (36.7 C) (Oral)   Resp 16   Ht '5\' 2"'$  (1.575 m)   Wt 225 lb 12.8 oz (102.4 kg)   SpO2 98%   BMI 41.30 kg/m   Physical Exam Vitals and nursing note reviewed.  Constitutional:      General: She is not in acute distress. Cardiovascular:     Rate and Rhythm: Normal rate and regular rhythm.  Pulmonary:     Effort: Pulmonary effort is normal.     Breath sounds: Normal breath sounds.   Abdominal:     Palpations: Abdomen is soft.     Tenderness: There is no abdominal tenderness.  Neurological:     General: No focal deficit present.     Mental Status: She is alert and oriented to person, place, and time.         Assessment & Plan:   1. Iron deficiency anemia due to chronic blood loss Management per consultant.   2. Class 3 severe obesity due to excess calories without serious comorbidity with body mass index (BMI) of 40.0 to 44.9 in adult ([place])   3. Encounter to establish care    Return in about 3 months (around 09/08/2022) for physical.   Becky Sax, MD

## 2022-06-10 IMAGING — US US EXTREM LOW*R* LIMITED
1 series · 12 of 12 positions shown · non-contrast
Comparison: None.

CLINICAL DATA: Posterior right ankle mass for the past month.

EXAM:
ULTRASOUND RIGHT LOWER EXTREMITY LIMITED
TECHNIQUE: Ultrasound examination of the lower extremity soft tissues was
performed in the area of clinical concern.

[Series 1: us extrem low*right* limited · 0.04mm/px · 12 acquisitions, 12 frames shown]
[im 1/12]
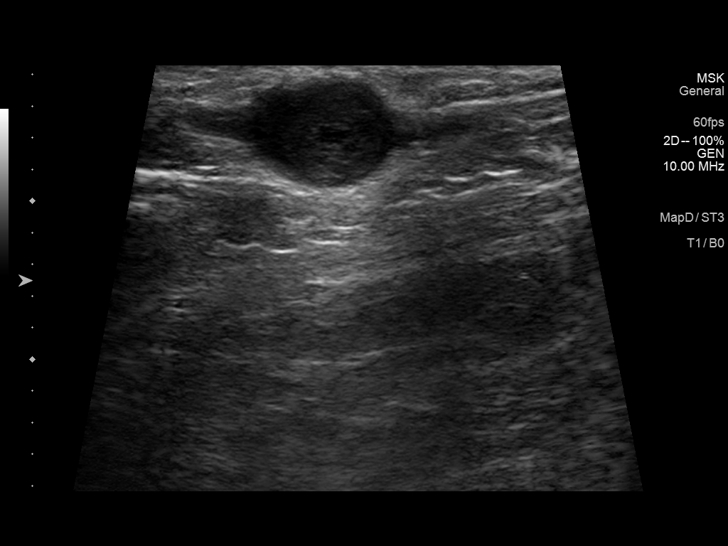
[im 2/12]
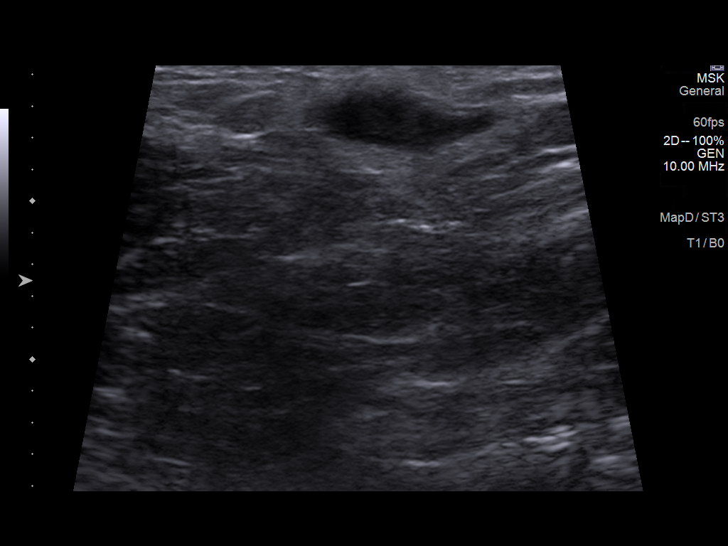
[im 3/12]
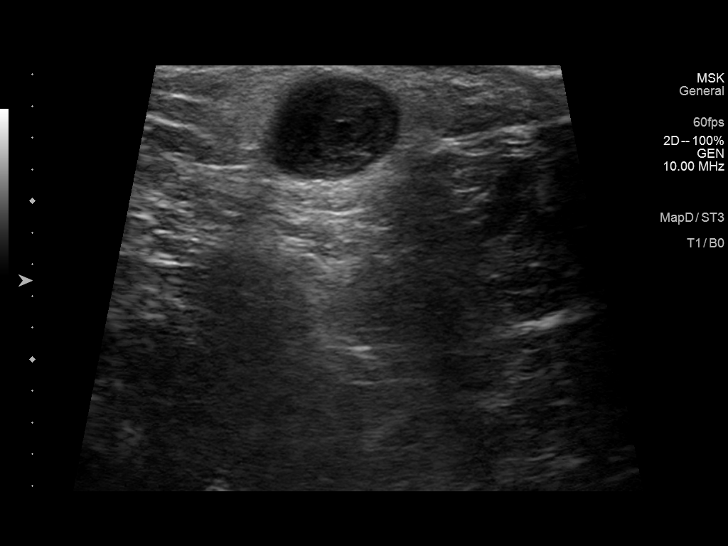
[im 4/12]
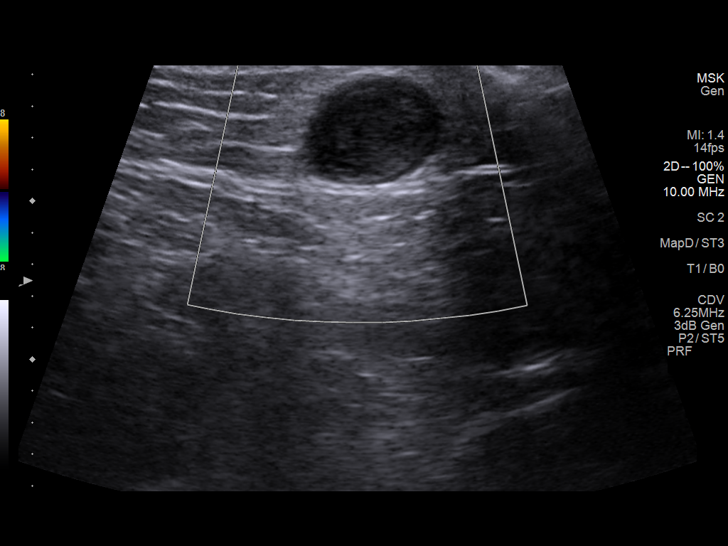
[im 5/12]
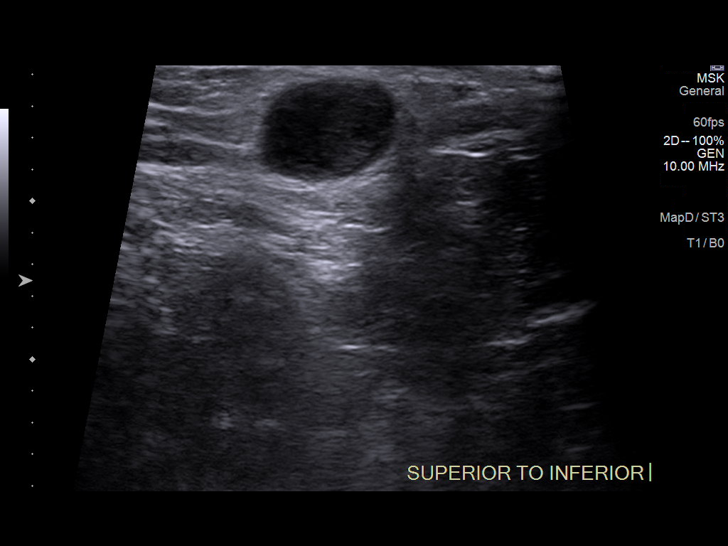
[im 6/12]
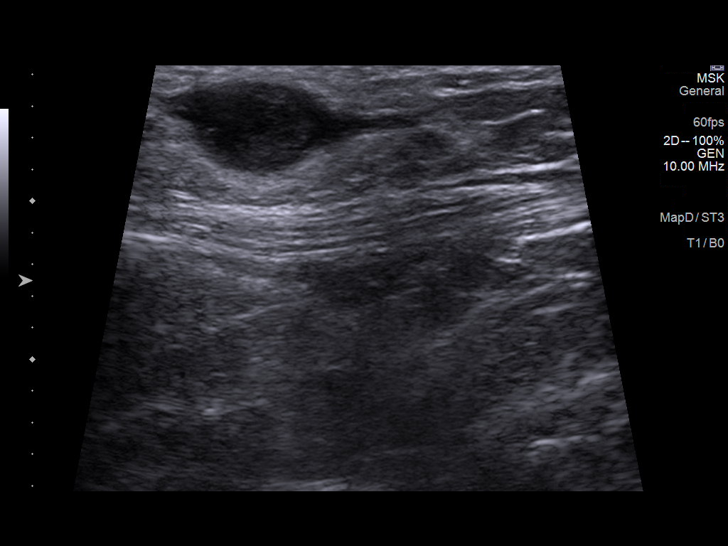
[im 7/12]
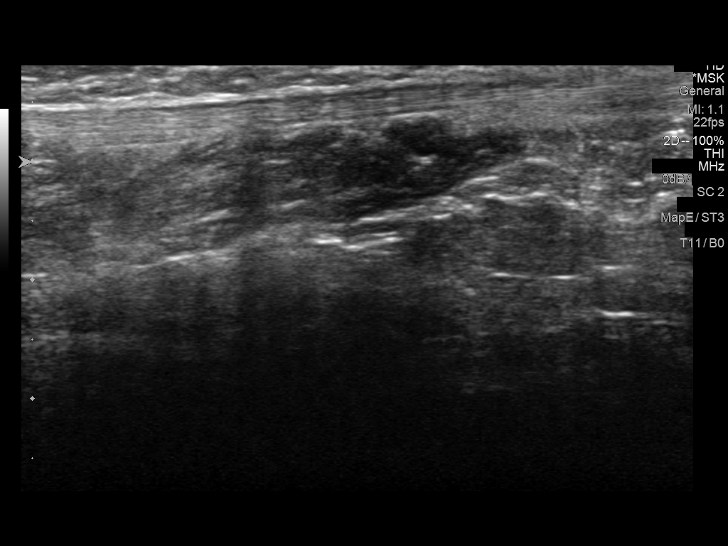
[im 8/12]
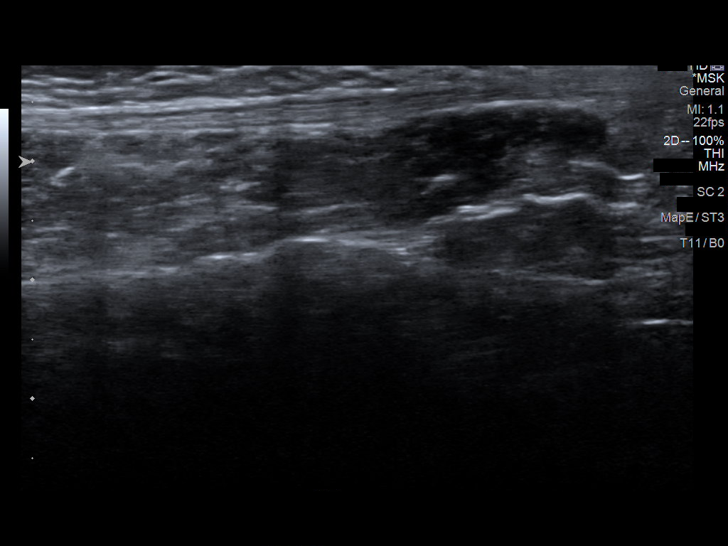
[im 9/12]
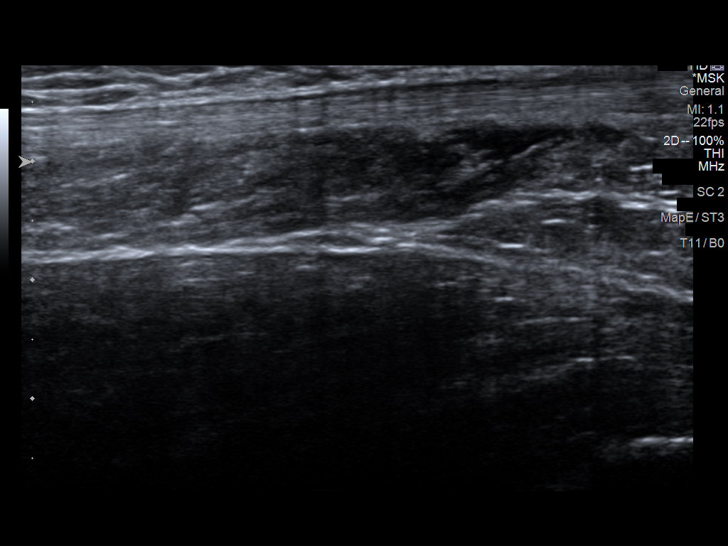
[im 10/12]
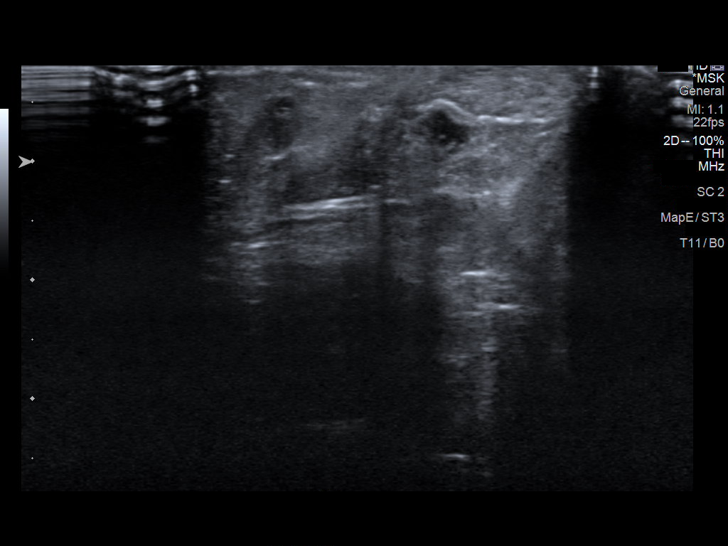
[im 11/12]
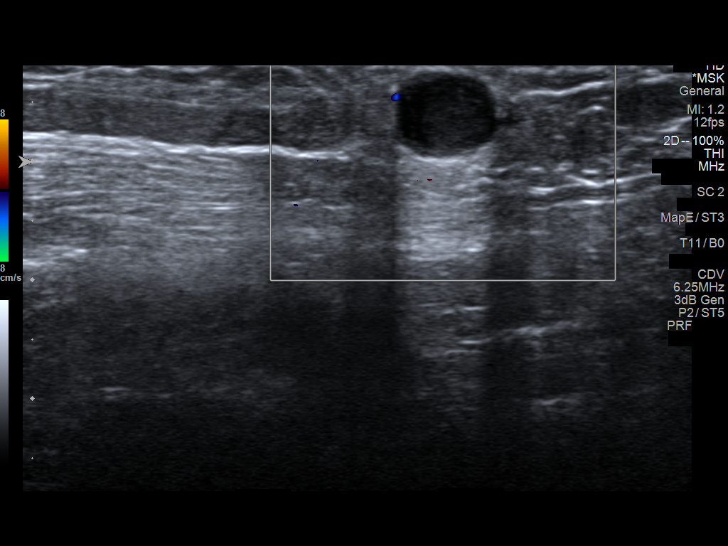
[im 12/12]
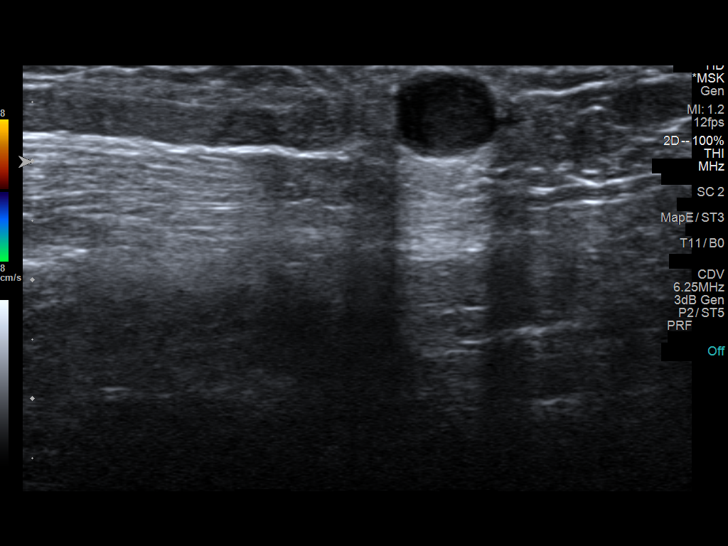

[12 of 12 positions shown; findings below may reference images not displayed]

FINDINGS: Focused ultrasound of the posterior right ankle in the region of the
palpable abnormality demonstrates an oval, well-circumscribed,
superficial, relatively homogeneous hypoechoic mass with posterior
acoustic enhancement. The mass measures approximately 8 x 9 x 7 mm.
There is no significant internal vascularity.
IMPRESSION: 1. 9 mm complex mass in the region of palpable abnormality.
Appearance is nonspecific. Differential considerations include
epidermal inclusion cyst and peripheral nerve sheath tumor.
Recommend MRI of the right ankle with and without contrast for
further evaluation.

## 2022-08-17 ENCOUNTER — Inpatient Hospital Stay (HOSPITAL_BASED_OUTPATIENT_CLINIC_OR_DEPARTMENT_OTHER): Payer: Managed Care, Other (non HMO) | Admitting: Hematology and Oncology

## 2022-08-17 ENCOUNTER — Other Ambulatory Visit: Payer: Self-pay

## 2022-08-17 ENCOUNTER — Encounter: Payer: Self-pay | Admitting: Hematology and Oncology

## 2022-08-17 ENCOUNTER — Inpatient Hospital Stay: Payer: Managed Care, Other (non HMO) | Attending: Hematology and Oncology

## 2022-08-17 VITALS — BP 134/79 | HR 72 | Temp 97.9°F | Resp 16 | Ht 62.0 in | Wt 236.7 lb

## 2022-08-17 DIAGNOSIS — N920 Excessive and frequent menstruation with regular cycle: Secondary | ICD-10-CM | POA: Diagnosis not present

## 2022-08-17 DIAGNOSIS — D500 Iron deficiency anemia secondary to blood loss (chronic): Secondary | ICD-10-CM

## 2022-08-17 DIAGNOSIS — D509 Iron deficiency anemia, unspecified: Secondary | ICD-10-CM | POA: Insufficient documentation

## 2022-08-17 DIAGNOSIS — R0602 Shortness of breath: Secondary | ICD-10-CM | POA: Insufficient documentation

## 2022-08-17 LAB — IRON AND IRON BINDING CAPACITY (CC-WL,HP ONLY)
Iron: 46 ug/dL (ref 28–170)
Saturation Ratios: 15 % (ref 10.4–31.8)
TIBC: 316 ug/dL (ref 250–450)
UIBC: 270 ug/dL (ref 148–442)

## 2022-08-17 LAB — CBC WITH DIFFERENTIAL/PLATELET
Abs Immature Granulocytes: 0 10*3/uL (ref 0.00–0.07)
Basophils Absolute: 0 10*3/uL (ref 0.0–0.1)
Basophils Relative: 1 %
Eosinophils Absolute: 0.2 10*3/uL (ref 0.0–0.5)
Eosinophils Relative: 4 %
HCT: 36.4 % (ref 36.0–46.0)
Hemoglobin: 11.7 g/dL — ABNORMAL LOW (ref 12.0–15.0)
Immature Granulocytes: 0 %
Lymphocytes Relative: 38 %
Lymphs Abs: 1.9 10*3/uL (ref 0.7–4.0)
MCH: 27.3 pg (ref 26.0–34.0)
MCHC: 32.1 g/dL (ref 30.0–36.0)
MCV: 84.8 fL (ref 80.0–100.0)
Monocytes Absolute: 0.4 10*3/uL (ref 0.1–1.0)
Monocytes Relative: 7 %
Neutro Abs: 2.6 10*3/uL (ref 1.7–7.7)
Neutrophils Relative %: 50 %
Platelets: 328 10*3/uL (ref 150–400)
RBC: 4.29 MIL/uL (ref 3.87–5.11)
RDW: 16.6 % — ABNORMAL HIGH (ref 11.5–15.5)
WBC: 5.2 10*3/uL (ref 4.0–10.5)
nRBC: 0 % (ref 0.0–0.2)

## 2022-08-17 LAB — FERRITIN: Ferritin: 11 ng/mL (ref 11–307)

## 2022-08-17 NOTE — Progress Notes (Signed)
[name], [name]  CHIEF COMPLAINTS/PURPOSE OF CONSULTATION:  Normocytic normochromic anemia.  ASSESSMENT & PLAN:  This is a very pleasant 32 year old female patient with no significant past medical history referred to hematology for evaluation and recommendations regarding severe iron deficiency anemia.   Since her last visit she received IV iron.  She certainly feels better from that shortness of breath standpoint.  She continues to feel fatigued.  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  She continues to have heavy menstrual cycles.  Physical examination today without any concerns  CBC today showed hemoglobin of 11.7 g/dL. Iron panel normal, ferritin pending. I recommended that she continue oral iron and return to clinic in about 6 months.  HISTORY OF PRESENTING ILLNESS:  Joanna Hull 32 y.o. female is here because of severe anemia.  This is a very pleasant 32 year old female patient with no significant past medical history referred to hematology with severe anemia secondary to heavy menstrual cycles.  Since her last visit here, she received IV iron which she tolerated very well.  She denies any more pica.  Shortness of breath is also significantly improved. Rest of the pertinent 10 point ROS reviewed and negative  MEDICAL HISTORY:  No past medical history on file.  SURGICAL HISTORY: No past surgical history on file.  SOCIAL HISTORY: Social History   Socioeconomic History   Marital status: Single    Spouse name: Not on file   Number of children: Not on file   Years of education: Not on file   Highest education level: Not on file  Occupational History   Not on file  Tobacco Use   Smoking status: Never   Smokeless tobacco: Never  Substance and Sexual Activity   Alcohol use: Never   Drug use: Never   Sexual activity: Not on file  Other Topics Concern   Not on file   Social History Narrative   Not on file   Social Determinants of Health   Financial Resource Strain: Not on file  Food Insecurity: Not on file  Transportation Needs: Not on file  Physical Activity: Not on file  Stress: Not on file  Social Connections: Not on file  Intimate Partner Violence: Not on file    FAMILY HISTORY: No family history on file.  ALLERGIES:  has No Known Allergies.  MEDICATIONS:  No current outpatient medications on file.   No current facility-administered medications for this visit.   PHYSICAL EXAMINATION: ECOG PERFORMANCE STATUS: 0 - Asymptomatic  Vitals:   08/17/22 1122  BP: 134/79  Pulse: 72  Resp: 16  Temp: 97.9 F (36.6 C)  SpO2: 100%   Filed Weights   08/17/22 1122  Weight: 236 lb 11.2 oz (107.4 kg)    GENERAL:alert, no distress and comfortable SKIN: skin color, texture, turgor are normal, no rashes or significant lesions EYES: normal, conjunctiva are pink and non-injected, sclera clear OROPHARYNX:no exudate, no erythema and lips, buccal mucosa, and tongue normal  NECK: supple, thyroid normal size, non-tender, without nodularity LYMPH:  no palpable lymphadenopathy in the cervical, axillary  LUNGS: clear to auscultation and percussion with normal breathing effort HEART: regular rate & rhythm and no murmurs and no lower extremity edema ABDOMEN:abdomen soft, non-tender and normal bowel sounds Musculoskeletal:no cyanosis of digits and no clubbing  PSYCH: alert & oriented x 3 with fluent speech NEURO: no focal motor/sensory deficits  LABORATORY DATA:  I have  reviewed the data as listed Lab Results  Component Value Date   WBC 5.2 08/17/2022   HGB 11.7 (L) 08/17/2022   HCT 36.4 08/17/2022   MCV 84.8 08/17/2022   PLT 328 08/17/2022     Chemistry      Component Value Date/Time   NA 136 04/26/2022 0225   K 4.2 04/26/2022 0225   CL 109 04/26/2022 0225   CO2 23 04/26/2022 0225   BUN 9 04/26/2022 0225   CREATININE 0.62 04/26/2022  0225      Component Value Date/Time   CALCIUM 8.7 (L) 04/26/2022 0225   ALKPHOS 54 04/26/2022 0225   AST 42 (H) 04/26/2022 0225   ALT 20 04/26/2022 0225   BILITOT 0.5 04/26/2022 0225      Labs from today reviewed  RADIOGRAPHIC STUDIES: I have personally reviewed the radiological images as listed and agreed with the findings in the report. No results found.  All questions were answered. The patient knows to call the clinic with any problems, questions or concerns. I spent 20 minutes in the care of this patient including H and P, review of records, counseling and coordination of care.     Benay Pike, MD 08/17/2022 11:31 AM

## 2022-08-18 ENCOUNTER — Telehealth: Payer: Self-pay | Admitting: Hematology and Oncology

## 2022-08-18 NOTE — Telephone Encounter (Signed)
Spoke with patient confirming upcoming appointment  

## 2022-09-06 ENCOUNTER — Encounter: Payer: Self-pay | Admitting: Family Medicine

## 2022-09-06 ENCOUNTER — Ambulatory Visit (INDEPENDENT_AMBULATORY_CARE_PROVIDER_SITE_OTHER): Payer: Managed Care, Other (non HMO) | Admitting: Family Medicine

## 2022-09-06 VITALS — BP 135/88 | HR 69 | Temp 98.1°F | Resp 16 | Ht 62.0 in | Wt 240.0 lb

## 2022-09-06 DIAGNOSIS — Z13220 Encounter for screening for lipoid disorders: Secondary | ICD-10-CM | POA: Diagnosis not present

## 2022-09-06 DIAGNOSIS — Z1159 Encounter for screening for other viral diseases: Secondary | ICD-10-CM | POA: Diagnosis not present

## 2022-09-06 DIAGNOSIS — E6609 Other obesity due to excess calories: Secondary | ICD-10-CM

## 2022-09-06 DIAGNOSIS — Z130 Encounter for screening for diseases of the blood and blood-forming organs and certain disorders involving the immune mechanism: Secondary | ICD-10-CM

## 2022-09-06 DIAGNOSIS — Z114 Encounter for screening for human immunodeficiency virus [HIV]: Secondary | ICD-10-CM | POA: Diagnosis not present

## 2022-09-06 DIAGNOSIS — Z0000 Encounter for general adult medical examination without abnormal findings: Secondary | ICD-10-CM | POA: Diagnosis not present

## 2022-09-06 DIAGNOSIS — Z6841 Body Mass Index (BMI) 40.0 and over, adult: Secondary | ICD-10-CM

## 2022-09-06 NOTE — Progress Notes (Unsigned)
   New Patient Office Visit  Subjective    Patient ID: Joanna Hull, female    DOB: [birth year]  Age: 32 y.o. MRN: [id number]  CC:  Chief Complaint  Patient presents with   Annual Exam    HPI Joanna Hull presents to establish care ***  No outpatient encounter medications on file as of 09/06/2022.   No facility-administered encounter medications on file as of 09/06/2022.    No past medical history on file.  No past surgical history on file.  No family history on file.  Social History   Socioeconomic History   Marital status: Single    Spouse name: Not on file   Number of children: Not on file   Years of education: Not on file   Highest education level: Not on file  Occupational History   Not on file  Tobacco Use   Smoking status: Never   Smokeless tobacco: Never  Substance and Sexual Activity   Alcohol use: Never   Drug use: Never   Sexual activity: Not on file  Other Topics Concern   Not on file  Social History Narrative   Not on file   Social Determinants of Health   Financial Resource Strain: Not on file  Food Insecurity: Not on file  Transportation Needs: Not on file  Physical Activity: Not on file  Stress: Not on file  Social Connections: Not on file  Intimate Partner Violence: Not on file    ROS      Objective    BP 135/88   Pulse 69   Temp 98.1 F (36.7 C) (Oral)   Resp 16   Ht 5\' 2"  (1.575 m)   Wt 240 lb (108.9 kg)   SpO2 97%   BMI 43.90 kg/m   Physical Exam  {Labs (Optional):23779}    Assessment & Plan:   Problem List Items Addressed This Visit   None Visit Diagnoses     Annual physical exam    -  Primary   Screening for deficiency anemia       Screening for lipid disorders           No follow-ups on file.   Becky Sax, MD

## 2022-09-07 LAB — LIPID PANEL
Chol/HDL Ratio: 5.5 ratio — ABNORMAL HIGH (ref 0.0–4.4)
Cholesterol, Total: 252 mg/dL — ABNORMAL HIGH (ref 100–199)
HDL: 46 mg/dL (ref >39)
LDL Chol Calc (NIH): 165 mg/dL — ABNORMAL HIGH (ref 0–99)
Triglycerides: 220 mg/dL — ABNORMAL HIGH (ref 0–149)
VLDL Cholesterol Cal: 41 mg/dL — ABNORMAL HIGH (ref 5–40)

## 2022-09-07 LAB — CMP14+EGFR
ALT: 15 IU/L (ref 0–32)
AST: 14 IU/L (ref 0–40)
Albumin/Globulin Ratio: 1.3 (ref 1.2–2.2)
Albumin: 3.9 g/dL (ref 3.9–4.9)
Alkaline Phosphatase: 65 IU/L (ref 44–121)
BUN/Creatinine Ratio: 16 (ref 9–23)
BUN: 11 mg/dL (ref 6–20)
Bilirubin Total: <0.2 mg/dL (ref 0.0–1.2)
CO2: 24 mmol/L (ref 20–29)
Calcium: 9.5 mg/dL (ref 8.7–10.2)
Chloride: 103 mmol/L (ref 96–106)
Creatinine, Ser: 0.69 mg/dL (ref 0.57–1.00)
Globulin, Total: 3 g/dL (ref 1.5–4.5)
Glucose: 83 mg/dL (ref 70–99)
Potassium: 4.6 mmol/L (ref 3.5–5.2)
Sodium: 139 mmol/L (ref 134–144)
Total Protein: 6.9 g/dL (ref 6.0–8.5)
eGFR: 117 mL/min/{1.73_m2} (ref >59)

## 2022-09-07 LAB — CBC WITH DIFFERENTIAL/PLATELET
Basophils Absolute: 0 10*3/uL (ref 0.0–0.2)
Basos: 1 %
EOS (ABSOLUTE): 0.2 10*3/uL (ref 0.0–0.4)
Eos: 5 %
Hematocrit: 35.3 % (ref 34.0–46.6)
Hemoglobin: 11.4 g/dL (ref 11.1–15.9)
Immature Grans (Abs): 0 10*3/uL (ref 0.0–0.1)
Immature Granulocytes: 0 %
Lymphocytes Absolute: 1.8 10*3/uL (ref 0.7–3.1)
Lymphs: 39 %
MCH: 26.7 pg (ref 26.6–33.0)
MCHC: 32.3 g/dL (ref 31.5–35.7)
MCV: 83 fL (ref 79–97)
Monocytes Absolute: 0.3 10*3/uL (ref 0.1–0.9)
Monocytes: 7 %
Neutrophils Absolute: 2.3 10*3/uL (ref 1.4–7.0)
Neutrophils: 48 %
Platelets: 338 10*3/uL (ref 150–450)
RBC: 4.27 x10E6/uL (ref 3.77–5.28)
RDW: 14.8 % (ref 11.7–15.4)
WBC: 4.7 10*3/uL (ref 3.4–10.8)

## 2022-09-07 LAB — HIV ANTIBODY (ROUTINE TESTING W REFLEX): HIV Screen 4th Generation wRfx: NONREACTIVE

## 2022-09-07 LAB — HEPATITIS C ANTIBODY: Hep C Virus Ab: NONREACTIVE

## 2022-09-08 ENCOUNTER — Encounter: Payer: Self-pay | Admitting: Family Medicine

## 2023-02-15 ENCOUNTER — Inpatient Hospital Stay: Payer: Managed Care, Other (non HMO) | Attending: Hematology and Oncology | Admitting: Hematology and Oncology

## 2023-02-15 ENCOUNTER — Inpatient Hospital Stay: Payer: Managed Care, Other (non HMO)

## 2023-02-15 VITALS — BP 137/81 | HR 76 | Temp 98.4°F | Resp 16 | Wt 242.7 lb

## 2023-02-15 DIAGNOSIS — D500 Iron deficiency anemia secondary to blood loss (chronic): Secondary | ICD-10-CM

## 2023-02-15 DIAGNOSIS — N920 Excessive and frequent menstruation with regular cycle: Secondary | ICD-10-CM | POA: Diagnosis not present

## 2023-02-15 DIAGNOSIS — Z79899 Other long term (current) drug therapy: Secondary | ICD-10-CM | POA: Insufficient documentation

## 2023-02-15 DIAGNOSIS — D509 Iron deficiency anemia, unspecified: Secondary | ICD-10-CM | POA: Diagnosis present

## 2023-02-15 LAB — CBC WITH DIFFERENTIAL/PLATELET
Abs Immature Granulocytes: 0.01 10*3/uL (ref 0.00–0.07)
Basophils Absolute: 0 10*3/uL (ref 0.0–0.1)
Basophils Relative: 0 %
Eosinophils Absolute: 0.3 10*3/uL (ref 0.0–0.5)
Eosinophils Relative: 4 %
HCT: 30.7 % — ABNORMAL LOW (ref 36.0–46.0)
Hemoglobin: 9.1 g/dL — ABNORMAL LOW (ref 12.0–15.0)
Immature Granulocytes: 0 %
Lymphocytes Relative: 34 %
Lymphs Abs: 2.3 10*3/uL (ref 0.7–4.0)
MCH: 20.3 pg — ABNORMAL LOW (ref 26.0–34.0)
MCHC: 29.6 g/dL — ABNORMAL LOW (ref 30.0–36.0)
MCV: 68.4 fL — ABNORMAL LOW (ref 80.0–100.0)
Monocytes Absolute: 0.5 10*3/uL (ref 0.1–1.0)
Monocytes Relative: 7 %
Neutro Abs: 3.6 10*3/uL (ref 1.7–7.7)
Neutrophils Relative %: 55 %
Platelets: 338 10*3/uL (ref 150–400)
RBC: 4.49 MIL/uL (ref 3.87–5.11)
RDW: 17.4 % — ABNORMAL HIGH (ref 11.5–15.5)
WBC: 6.7 10*3/uL (ref 4.0–10.5)
nRBC: 0 % (ref 0.0–0.2)

## 2023-02-15 LAB — IRON AND IRON BINDING CAPACITY (CC-WL,HP ONLY)
Iron: 21 ug/dL — ABNORMAL LOW (ref 28–170)
Saturation Ratios: 5 % — ABNORMAL LOW (ref 10.4–31.8)
TIBC: 391 ug/dL (ref 250–450)
UIBC: 370 ug/dL (ref 148–442)

## 2023-02-15 LAB — FERRITIN: Ferritin: 3 ng/mL — ABNORMAL LOW (ref 11–307)

## 2023-02-15 NOTE — Progress Notes (Signed)
[facility] CONSULT NOTE  Patient Care Team: Georganna Skeans, [name]  CHIEF COMPLAINTS/PURPOSE OF CONSULTATION:  Normocytic normochromic anemia.  ASSESSMENT & PLAN:   This is a very pleasant 32 year old female patient with no significant past medical history referred to hematology for evaluation and recommendations regarding severe iron deficiency anemia.   Since her last visit here, she continues to feel well, no symptoms concerning for worsening iron deficiency anemia.  Physical examination with no significant findings.  Labs however showed significant drop in hemoglobin, iron saturation suggest iron deficiency, ferritin pending.  She is currently taking oral iron ferrous sulfate 65/325 daily although she forgets sometimes.  If her ferritin does indicate worsening iron stores, she may have to increase the dose of ferrous sulfate to twice a day.  We can also bring her back in about 3 months for repeat labs to see if she will need another iron infusion.  HISTORY OF PRESENTING ILLNESS:  Joanna Hull 32 y.o. female is here because of severe anemia.  This is a very pleasant 32 year old female patient with no significant past medical history referred to hematology with severe anemia secondary to heavy menstrual cycles.  Since her last visit here, she has been feeling better.  She is a Engineer, civil (consulting) in the [employer].  She denies any fatigue, pica, shortness of breath, dizziness or palpitations.  She continues to have heavy menstrual cycles, no other bleeding reported. Rest of the pertinent 10 point ROS reviewed and negative  MEDICAL HISTORY:  No past medical history on file.  SURGICAL HISTORY: No past surgical history on file.  SOCIAL HISTORY: Social History   Socioeconomic History   Marital status: Single    Spouse name: Not on file   Number of children: Not on file   Years of education: Not on file   Highest education level: Not on file  Occupational  History   Not on file  Tobacco Use   Smoking status: Never   Smokeless tobacco: Never  Substance and Sexual Activity   Alcohol use: Never   Drug use: Never   Sexual activity: Not on file  Other Topics Concern   Not on file  Social History Narrative   Not on file   Social Determinants of Health   Financial Resource Strain: Not on file  Food Insecurity: Not on file  Transportation Needs: Not on file  Physical Activity: Not on file  Stress: Not on file  Social Connections: Not on file  Intimate Partner Violence: Not on file    FAMILY HISTORY: No family history on file.  ALLERGIES:  has No Known Allergies.  MEDICATIONS:  Current Outpatient Medications  Medication Sig Dispense Refill   ferrous sulfate 325 (65 FE) MG tablet Take 325 mg by mouth daily with breakfast.     No current facility-administered medications for this visit.   PHYSICAL EXAMINATION: ECOG PERFORMANCE STATUS: 0 - Asymptomatic  Vitals:   02/15/23 1144  BP: 137/81  Pulse: 76  Resp: 16  Temp: 98.4 F (36.9 C)  SpO2: 100%   Filed Weights   02/15/23 1144  Weight: 242 lb 11.2 oz (110.1 kg)    GENERAL:alert, no distress and comfortable SKIN: skin color, texture, turgor are normal, no rashes or significant lesions EYES: normal, conjunctiva are pink and non-injected, sclera clear OROPHARYNX:no exudate, no erythema and lips, buccal mucosa, and tongue normal  NECK: supple, thyroid normal size, non-tender, without nodularity LYMPH:  no palpable lymphadenopathy in the cervical, axillary  LUNGS: clear to auscultation and percussion with normal breathing effort HEART: regular rate & rhythm and no murmurs and no lower extremity edema ABDOMEN:abdomen soft, non-tender and normal bowel sounds Musculoskeletal:no cyanosis of digits and no clubbing  PSYCH: alert & oriented x 3 with fluent speech NEURO: no focal motor/sensory deficits  LABORATORY DATA:  I have reviewed the data as listed Lab Results   Component Value Date   WBC 6.7 02/15/2023   HGB 9.1 (L) 02/15/2023   HCT 30.7 (L) 02/15/2023   MCV 68.4 (L) 02/15/2023   PLT 338 02/15/2023     Chemistry      Component Value Date/Time   NA 139 09/06/2022 1032   K 4.6 09/06/2022 1032   CL 103 09/06/2022 1032   CO2 24 09/06/2022 1032   BUN 11 09/06/2022 1032   CREATININE 0.69 09/06/2022 1032      Component Value Date/Time   CALCIUM 9.5 09/06/2022 1032   ALKPHOS 65 09/06/2022 1032   AST 14 09/06/2022 1032   ALT 15 09/06/2022 1032   BILITOT <0.2 09/06/2022 1032      Labs from today reviewed.  Hemoglobin dropped from 11.4 to 9.4, microcytosis and hypochromia noted   Iron panel shows saturation ratio of 5, ferritin pending  RADIOGRAPHIC STUDIES: I have personally reviewed the radiological images as listed and agreed with the findings in the report. No results found.  All questions were answered. The patient knows to call the clinic with any problems, questions or concerns. I spent 30 minutes in the care of this patient including H and P, review of records, counseling and coordination of care.     Rachel Moulds, MD 02/15/2023 2:55 PM

## 2023-02-16 ENCOUNTER — Telehealth: Payer: Self-pay | Admitting: Hematology and Oncology

## 2023-02-16 NOTE — Telephone Encounter (Signed)
Patient is aware of rescheduled appointment times/dates regarding rescheduled appointment

## 2023-02-25 ENCOUNTER — Other Ambulatory Visit: Payer: Self-pay | Admitting: Hematology and Oncology

## 2023-02-25 NOTE — Progress Notes (Signed)
Venofer plan placed. ?

## 2023-04-28 ENCOUNTER — Inpatient Hospital Stay: Payer: Managed Care, Other (non HMO) | Admitting: Hematology and Oncology

## 2023-04-28 ENCOUNTER — Inpatient Hospital Stay: Payer: Managed Care, Other (non HMO)

## 2023-05-16 ENCOUNTER — Inpatient Hospital Stay: Payer: Managed Care, Other (non HMO) | Admitting: Hematology and Oncology

## 2023-05-16 ENCOUNTER — Inpatient Hospital Stay: Payer: Managed Care, Other (non HMO) | Attending: Hematology and Oncology

## 2023-05-16 DIAGNOSIS — D500 Iron deficiency anemia secondary to blood loss (chronic): Secondary | ICD-10-CM

## 2023-05-16 DIAGNOSIS — D509 Iron deficiency anemia, unspecified: Secondary | ICD-10-CM | POA: Insufficient documentation

## 2023-05-16 LAB — CBC WITH DIFFERENTIAL/PLATELET
Abs Immature Granulocytes: 0.02 10*3/uL (ref 0.00–0.07)
Basophils Absolute: 0 10*3/uL (ref 0.0–0.1)
Basophils Relative: 1 %
Eosinophils Absolute: 0.2 10*3/uL (ref 0.0–0.5)
Eosinophils Relative: 3 %
HCT: 35.7 % — ABNORMAL LOW (ref 36.0–46.0)
Hemoglobin: 10.4 g/dL — ABNORMAL LOW (ref 12.0–15.0)
Immature Granulocytes: 0 %
Lymphocytes Relative: 33 %
Lymphs Abs: 2 10*3/uL (ref 0.7–4.0)
MCH: 21.3 pg — ABNORMAL LOW (ref 26.0–34.0)
MCHC: 29.1 g/dL — ABNORMAL LOW (ref 30.0–36.0)
MCV: 73 fL — ABNORMAL LOW (ref 80.0–100.0)
Monocytes Absolute: 0.4 10*3/uL (ref 0.1–1.0)
Monocytes Relative: 6 %
Neutro Abs: 3.5 10*3/uL (ref 1.7–7.7)
Neutrophils Relative %: 57 %
Platelets: 407 10*3/uL — ABNORMAL HIGH (ref 150–400)
RBC: 4.89 MIL/uL (ref 3.87–5.11)
RDW: 22.6 % — ABNORMAL HIGH (ref 11.5–15.5)
WBC: 6.1 10*3/uL (ref 4.0–10.5)
nRBC: 0 % (ref 0.0–0.2)

## 2023-05-18 ENCOUNTER — Other Ambulatory Visit: Payer: Self-pay | Admitting: *Deleted

## 2023-05-18 DIAGNOSIS — D500 Iron deficiency anemia secondary to blood loss (chronic): Secondary | ICD-10-CM

## 2023-05-25 ENCOUNTER — Inpatient Hospital Stay: Payer: Managed Care, Other (non HMO)

## 2023-05-25 ENCOUNTER — Inpatient Hospital Stay: Payer: Managed Care, Other (non HMO) | Attending: Hematology and Oncology | Admitting: Hematology and Oncology

## 2023-05-25 ENCOUNTER — Encounter: Payer: Self-pay | Admitting: Hematology and Oncology

## 2023-05-25 VITALS — BP 141/79 | HR 76 | Temp 98.1°F | Resp 16 | Wt 238.6 lb

## 2023-05-25 DIAGNOSIS — N920 Excessive and frequent menstruation with regular cycle: Secondary | ICD-10-CM | POA: Diagnosis not present

## 2023-05-25 DIAGNOSIS — D509 Iron deficiency anemia, unspecified: Secondary | ICD-10-CM | POA: Insufficient documentation

## 2023-05-25 DIAGNOSIS — R5383 Other fatigue: Secondary | ICD-10-CM | POA: Diagnosis not present

## 2023-05-25 DIAGNOSIS — Z79890 Hormone replacement therapy (postmenopausal): Secondary | ICD-10-CM | POA: Insufficient documentation

## 2023-05-25 DIAGNOSIS — R011 Cardiac murmur, unspecified: Secondary | ICD-10-CM | POA: Insufficient documentation

## 2023-05-25 DIAGNOSIS — D500 Iron deficiency anemia secondary to blood loss (chronic): Secondary | ICD-10-CM | POA: Diagnosis not present

## 2023-05-25 LAB — CBC WITH DIFFERENTIAL/PLATELET
Abs Immature Granulocytes: 0 10*3/uL (ref 0.00–0.07)
Basophils Absolute: 0 10*3/uL (ref 0.0–0.1)
Basophils Relative: 0 %
Eosinophils Absolute: 0.2 10*3/uL (ref 0.0–0.5)
Eosinophils Relative: 3 %
HCT: 37.8 % (ref 36.0–46.0)
Hemoglobin: 11.2 g/dL — ABNORMAL LOW (ref 12.0–15.0)
Immature Granulocytes: 0 %
Lymphocytes Relative: 32 %
Lymphs Abs: 1.8 10*3/uL (ref 0.7–4.0)
MCH: 22.8 pg — ABNORMAL LOW (ref 26.0–34.0)
MCHC: 29.6 g/dL — ABNORMAL LOW (ref 30.0–36.0)
MCV: 77 fL — ABNORMAL LOW (ref 80.0–100.0)
Monocytes Absolute: 0.4 10*3/uL (ref 0.1–1.0)
Monocytes Relative: 7 %
Neutro Abs: 3.2 10*3/uL (ref 1.7–7.7)
Neutrophils Relative %: 58 %
Platelets: 364 10*3/uL (ref 150–400)
RBC: 4.91 MIL/uL (ref 3.87–5.11)
RDW: 26.6 % — ABNORMAL HIGH (ref 11.5–15.5)
WBC: 5.5 10*3/uL (ref 4.0–10.5)
nRBC: 0 % (ref 0.0–0.2)

## 2023-05-25 LAB — IRON AND IRON BINDING CAPACITY (CC-WL,HP ONLY)
Iron: 80 ug/dL (ref 28–170)
Saturation Ratios: 24 % (ref 10.4–31.8)
TIBC: 336 ug/dL (ref 250–450)
UIBC: 256 ug/dL (ref 148–442)

## 2023-05-25 LAB — FERRITIN: Ferritin: 28 ng/mL (ref 11–307)

## 2023-05-25 NOTE — Progress Notes (Signed)
[facility] CONSULT NOTE  Patient Care Team: Georganna Skeans, [name]  CHIEF COMPLAINTS/PURPOSE OF CONSULTATION:  Normocytic normochromic anemia.  ASSESSMENT & PLAN:   This is a very pleasant 32 year old female patient with no significant past medical history referred to hematology for evaluation and recommendations regarding severe iron deficiency anemia.    Iron Deficiency  Not anemic but iron stores are depleted. Currently on daily oral iron supplementation but absorption may be suboptimal. Heavy menstrual cycles potentially contributing to iron deficiency, but recent initiation of progesterone has led to lighter cycles. -Continue daily oral iron supplementation and multivitamin. -Order iron infusion (same as previous well-tolerated regimen), likely three high-dose infusions starting next week if labs from today confirm ongoing IDA -Check iron panel and ferritin levels today.  Menorrhagia Heavy menstrual cycles, recently started on progesterone with resultant lighter cycles. -Continue progesterone 100mg  daily. -Monitor menstrual cycle changes.  Heart Murmur  Mild murmur noted on exam, improves with breath holding. -Recommend evaluation by primary care provider.  Follow-up in six months or sooner if needed.  HISTORY OF PRESENTING ILLNESS:  Joanna Hull 32 y.o. female is here because of severe anemia.  This is a very pleasant 32 year old female patient with no significant past medical history referred to hematology with severe anemia secondary to heavy menstrual cycles.   Discussed the use of AI scribe software for clinical note transcription with the patient, who gave verbal consent to proceed.  History of Present Illness    The patient, with a history of heavy menstrual cycles, presents with iron deficiency. She is not as anemic, but her iron stores are depleted, which could lead to anemia if not addressed. She is currently taking  an iron pill once a day, but it is suspected that she does not absorb it well.  She has recently started on progesterone for hormone therapy, which has resulted in lighter menstrual cycles. She has also added a multivitamin and vitamin D3 to her regimen. Despite the iron deficiency, she reports no symptoms such as shortness of breath, lightheadedness, palpitations, ice craving, or brittle nails. She does report feeling tired, but this is not a new symptom. Rest of the pertinent 10 point ROS reviewed and negative  MEDICAL HISTORY:  No past medical history on file.  SURGICAL HISTORY: No past surgical history on file.  SOCIAL HISTORY: Social History   Socioeconomic History   Marital status: Single    Spouse name: Not on file   Number of children: Not on file   Years of education: Not on file   Highest education level: Not on file  Occupational History   Not on file  Tobacco Use   Smoking status: Never   Smokeless tobacco: Never  Substance and Sexual Activity   Alcohol use: Never   Drug use: Never   Sexual activity: Not on file  Other Topics Concern   Not on file  Social History Narrative   Not on file   Social Determinants of Health   Financial Resource Strain: Not on file  Food Insecurity: Not on file  Transportation Needs: Not on file  Physical Activity: Not on file  Stress: Not on file  Social Connections: Not on file  Intimate Partner Violence: Not on file    FAMILY HISTORY: No family history on file.  ALLERGIES:  has No Known Allergies.  MEDICATIONS:  Current Outpatient Medications  Medication Sig Dispense Refill   ferrous sulfate 325 (65 FE) MG tablet Take 325  mg by mouth daily with breakfast.     No current facility-administered medications for this visit.   PHYSICAL EXAMINATION: ECOG PERFORMANCE STATUS: 0 - Asymptomatic  Vitals:   05/25/23 1153  BP: (!) 141/79  Pulse: 76  Resp: 16  Temp: 98.1 F (36.7 C)  SpO2: 100%   Filed Weights   05/25/23  1153  Weight: 238 lb 9.6 oz (108.2 kg)    GENERAL:alert, no distress and comfortable SKIN: skin color, texture, turgor are normal, no rashes or significant lesions EYES: normal, conjunctiva are pink and non-injected, sclera clear OROPHARYNX:no exudate, no erythema and lips, buccal mucosa, and tongue normal  NECK: supple, thyroid normal size, non-tender, without nodularity LYMPH:  no palpable lymphadenopathy in the cervical, axillary  LUNGS: clear to auscultation and percussion with normal breathing effort HEART: regular rate & rhythm and no murmurs and no lower extremity edema ABDOMEN:abdomen soft, non-tender and normal bowel sounds Musculoskeletal:no cyanosis of digits and no clubbing  PSYCH: alert & oriented x 3 with fluent speech NEURO: no focal motor/sensory deficits  LABORATORY DATA:  I have reviewed the data as listed Lab Results  Component Value Date   WBC 5.5 05/25/2023   HGB 11.2 (L) 05/25/2023   HCT 37.8 05/25/2023   MCV 77.0 (L) 05/25/2023   PLT 364 05/25/2023     Chemistry      Component Value Date/Time   NA 139 09/06/2022 1032   K 4.6 09/06/2022 1032   CL 103 09/06/2022 1032   CO2 24 09/06/2022 1032   BUN 11 09/06/2022 1032   CREATININE 0.69 09/06/2022 1032      Component Value Date/Time   CALCIUM 9.5 09/06/2022 1032   ALKPHOS 65 09/06/2022 1032   AST 14 09/06/2022 1032   ALT 15 09/06/2022 1032   BILITOT <0.2 09/06/2022 1032      Labs from today reviewed.  Hemoglobin dropped from 11.4 to 9.4, microcytosis and hypochromia noted   Iron panel shows saturation ratio of 5, ferritin pending  LABS Hb: 10.4 (05/16/2023) Hb: 11.2 (05/25/2023)  RADIOGRAPHIC STUDIES: I have personally reviewed the radiological images as listed and agreed with the findings in the report. No results found.  All questions were answered. The patient knows to call the clinic with any problems, questions or concerns. I spent 30 minutes in the care of this patient including H  and P, review of records, counseling and coordination of care.     Rachel Moulds, MD 05/25/2023 11:54 AM

## 2023-06-23 ENCOUNTER — Telehealth: Payer: Self-pay | Admitting: *Deleted

## 2023-06-23 NOTE — Telephone Encounter (Signed)
 LM with note below

## 2023-06-23 NOTE — Telephone Encounter (Signed)
-----   Message from Nurse Camie DEL sent at 06/23/2023  8:53 AM EST -----  ----- Message ----- From: Catha Andrea HERO, RN Sent: 06/21/2023   5:28 PM EST To: Camie DELENA Lauth, RN  Does not look like Val was able to call her. Andrea HERO Catha, RN ----- Message ----- From: Loretha Ash, MD Sent: 05/27/2023   9:43 AM EST To: Berwyn SHAUNNA Overly, RN  Ferritin is better, I would continue oral iron  supplementation, no indication for IV iron . She can RTC for follow up as scheduled.

## 2023-08-18 ENCOUNTER — Ambulatory Visit: Payer: Managed Care, Other (non HMO) | Admitting: Hematology and Oncology

## 2023-08-18 ENCOUNTER — Other Ambulatory Visit: Payer: Managed Care, Other (non HMO)

## 2023-09-02 ENCOUNTER — Encounter: Payer: Self-pay | Admitting: Hematology and Oncology

## 2023-09-09 ENCOUNTER — Encounter: Payer: Managed Care, Other (non HMO) | Admitting: Family Medicine

## 2023-11-22 ENCOUNTER — Telehealth: Payer: Self-pay

## 2023-11-22 NOTE — Telephone Encounter (Signed)
 Left a message to confirm appt for 6/4

## 2023-11-23 ENCOUNTER — Inpatient Hospital Stay: Payer: Managed Care, Other (non HMO)

## 2023-11-23 ENCOUNTER — Inpatient Hospital Stay: Payer: Managed Care, Other (non HMO) | Admitting: Hematology and Oncology

## 2023-12-01 ENCOUNTER — Other Ambulatory Visit: Payer: Self-pay | Admitting: *Deleted

## 2023-12-01 ENCOUNTER — Inpatient Hospital Stay (HOSPITAL_BASED_OUTPATIENT_CLINIC_OR_DEPARTMENT_OTHER): Admitting: Adult Health

## 2023-12-01 ENCOUNTER — Inpatient Hospital Stay: Attending: Adult Health

## 2023-12-01 ENCOUNTER — Inpatient Hospital Stay

## 2023-12-01 ENCOUNTER — Ambulatory Visit: Payer: Self-pay | Admitting: Hematology and Oncology

## 2023-12-01 VITALS — BP 132/87 | HR 85 | Temp 98.2°F | Resp 17 | Ht 62.0 in | Wt 213.6 lb

## 2023-12-01 DIAGNOSIS — N920 Excessive and frequent menstruation with regular cycle: Secondary | ICD-10-CM | POA: Insufficient documentation

## 2023-12-01 DIAGNOSIS — D509 Iron deficiency anemia, unspecified: Secondary | ICD-10-CM | POA: Insufficient documentation

## 2023-12-01 DIAGNOSIS — D500 Iron deficiency anemia secondary to blood loss (chronic): Secondary | ICD-10-CM

## 2023-12-01 LAB — IRON AND IRON BINDING CAPACITY (CC-WL,HP ONLY)
Iron: 26 ug/dL — ABNORMAL LOW (ref 28–170)
Saturation Ratios: 7 % — ABNORMAL LOW (ref 10.4–31.8)
TIBC: 370 ug/dL (ref 250–450)
UIBC: 344 ug/dL

## 2023-12-01 LAB — CBC WITH DIFFERENTIAL/PLATELET
Abs Immature Granulocytes: 0 K/uL (ref 0.00–0.07)
Basophils Absolute: 0 K/uL (ref 0.0–0.1)
Basophils Relative: 1 %
Eosinophils Absolute: 0.2 K/uL (ref 0.0–0.5)
Eosinophils Relative: 4 %
HCT: 33.8 % — ABNORMAL LOW (ref 36.0–46.0)
Hemoglobin: 11 g/dL — ABNORMAL LOW (ref 12.0–15.0)
Immature Granulocytes: 0 %
Lymphocytes Relative: 37 %
Lymphs Abs: 1.9 K/uL (ref 0.7–4.0)
MCH: 26 pg (ref 26.0–34.0)
MCHC: 32.5 g/dL (ref 30.0–36.0)
MCV: 79.9 fL — ABNORMAL LOW (ref 80.0–100.0)
Monocytes Absolute: 0.4 K/uL (ref 0.1–1.0)
Monocytes Relative: 7 %
Neutro Abs: 2.7 K/uL (ref 1.7–7.7)
Neutrophils Relative %: 51 %
Platelets: 376 K/uL (ref 150–400)
RBC: 4.23 MIL/uL (ref 3.87–5.11)
RDW: 14 % (ref 11.5–15.5)
WBC: 5.2 K/uL (ref 4.0–10.5)
nRBC: 0 % (ref 0.0–0.2)

## 2023-12-01 LAB — FERRITIN: Ferritin: 8 ng/mL — ABNORMAL LOW (ref 11–307)

## 2023-12-01 NOTE — Progress Notes (Signed)
 [name]:    Joanna Abo, MD [address] [place] [address]   DIAGNOSIS: iron  deficiency anemia   SUMMARY OF HEMATOLOGIC HISTORY: Referred to Dr. Arno Bibles for iron  deficiency anemia related to heavy menstrual cycles.   IV iron  with Venofer  every 2 days x 5 in 05/2022 Oral iron  daily Following with gyn on Progesterone   CURRENT THERAPY: Oral iron  daily  INTERVAL HISTORY:  Discussed the use of AI scribe software for clinical note transcription with the patient, who gave verbal consent to proceed.  Joanna Hull is a 33 year old female with iron  deficiency anemia who presents for follow-up.  Her most recent hemoglobin level is 11. Specific iron  studies were not ordered to be drawn with her CBC and she will do this after her appointment today.  She is currently taking iron  supplements once a day, previously twice a day, and is open to adjusting her dosage based on the results of her iron  studies.  She experiences heavy menstrual periods, which she believes contribute to her anemia. She has a fibroid, which may be related to her heavy periods. Birth control has been used to manage her periods, shortening the duration but not the heaviness. Potential treatments such as ablation or fibroid removal have been discussed with her gynecologist, but no definitive plan has been made.  In the review of symptoms, she has heavy periods. There is no blood in stool or black tarry stools.  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  MEDICAL HISTORY: No past medical history on file.  SURGICAL HISTORY: No past surgical history on file.  SOCIAL HISTORY: Social History   Socioeconomic History   Marital status: Single    Spouse name: Not on file   Number of children: Not on file   Years of education: Not on  file   Highest education level: Not on file  Occupational History   Not on file  Tobacco Use   Smoking status: Never   Smokeless tobacco: Never  Substance and Sexual Activity   Alcohol use: Never   Drug use: Never   Sexual activity: Not on file  Other Topics Concern   Not on file  Social History Narrative   Not on file   Social Drivers of Health   Financial Resource Strain: Not on file  Food Insecurity: Not on file  Transportation Needs: Not on file  Physical Activity: Not on file  Stress: Not on file  Social Connections: Not on file  Intimate Partner Violence: Not on file    FAMILY HISTORY: No family history on file.  Review of Systems  Constitutional:  Negative for appetite change, chills, fatigue, fever and unexpected weight change.  HENT:   Negative for hearing loss, lump/mass and trouble swallowing.   Eyes:  Negative for eye problems and icterus.  Respiratory:  Negative for chest tightness, cough and shortness of breath.   Cardiovascular:  Negative for chest pain, leg swelling and palpitations.  Gastrointestinal:  Negative for abdominal distention, abdominal pain, constipation, diarrhea, nausea and vomiting.  Endocrine: Negative for hot flashes.  Genitourinary:  Negative for difficulty urinating.   Musculoskeletal:  Negative for arthralgias.  Skin:  Negative for itching and rash.  Neurological:  Negative for dizziness, extremity weakness, headaches and numbness.  Hematological:  Negative for adenopathy. Does not bruise/bleed  easily.  Psychiatric/Behavioral:  Negative for depression. The patient is not nervous/anxious.       PHYSICAL EXAMINATION    Vitals:   12/01/23 1018  BP: 132/87  Pulse: 85  Resp: 17  Temp: 98.2 F (36.8 C)  SpO2: 100%    Physical Exam Constitutional:      General: She is not in acute distress.    Appearance: Normal appearance. She is not toxic-appearing.  HENT:     Head: Normocephalic and atraumatic.     Mouth/Throat:      Mouth: Mucous membranes are moist.     Pharynx: Oropharynx is clear. No oropharyngeal exudate or posterior oropharyngeal erythema.   Eyes:     General: No scleral icterus.   Cardiovascular:     Rate and Rhythm: Normal rate and regular rhythm.     Pulses: Normal pulses.     Heart sounds: Normal heart sounds.  Pulmonary:     Effort: Pulmonary effort is normal.     Breath sounds: Normal breath sounds.  Abdominal:     General: Abdomen is flat. Bowel sounds are normal. There is no distension.     Palpations: Abdomen is soft.     Tenderness: There is no abdominal tenderness.   Musculoskeletal:        General: No swelling.     Cervical back: Neck supple.  Lymphadenopathy:     Cervical: No cervical adenopathy.   Skin:    General: Skin is warm and dry.     Findings: No rash.   Neurological:     General: No focal deficit present.     Mental Status: She is alert.   Psychiatric:        Mood and Affect: Mood normal.        Behavior: Behavior normal.     LABORATORY DATA:  CBC    Component Value Date/Time   WBC 5.2 12/01/2023 0958   RBC 4.23 12/01/2023 0958   HGB 11.0 (L) 12/01/2023 0958   HGB 11.4 09/06/2022 1032   HCT 33.8 (L) 12/01/2023 0958   HCT 35.3 09/06/2022 1032   PLT 376 12/01/2023 0958   PLT 338 09/06/2022 1032   MCV 79.9 (L) 12/01/2023 0958   MCV 83 09/06/2022 1032   MCH 26.0 12/01/2023 0958   MCHC 32.5 12/01/2023 0958   RDW 14.0 12/01/2023 0958   RDW 14.8 09/06/2022 1032   LYMPHSABS 1.9 12/01/2023 0958   LYMPHSABS 1.8 09/06/2022 1032   MONOABS 0.4 12/01/2023 0958   EOSABS 0.2 12/01/2023 0958   EOSABS 0.2 09/06/2022 1032   BASOSABS 0.0 12/01/2023 0958   BASOSABS 0.0 09/06/2022 1032    CMP     Component Value Date/Time   NA 139 09/06/2022 1032   K 4.6 09/06/2022 1032   CL 103 09/06/2022 1032   CO2 24 09/06/2022 1032   GLUCOSE 83 09/06/2022 1032   GLUCOSE 109 (H) 04/26/2022 0225   BUN 11 09/06/2022 1032   CREATININE 0.69 09/06/2022 1032    CALCIUM 9.5 09/06/2022 1032   PROT 6.9 09/06/2022 1032   ALBUMIN 3.9 09/06/2022 1032   AST 14 09/06/2022 1032   ALT 15 09/06/2022 1032   ALKPHOS 65 09/06/2022 1032   BILITOT <0.2 09/06/2022 1032   GFRNONAA >60 04/26/2022 0225     ASSESSMENT and THERAPY PLAN:   Iron  deficiency anemia Improved hemoglobin levels and normalizing red blood cell indices. Heavy menstrual bleeding likely due to fibroid contributes to anemia. Interested in definitive management such as hysterectomy. -  Order iron  studies to assess current iron  levels. - Continue iron  supplementation once daily; adjust to twice daily if significant deficiency. - Coordinate with gynecology for management of heavy menstrual bleeding, considering ablation or fibroid removal.   All questions were answered. The patient knows to call the clinic with any problems, questions or concerns. We can certainly see the patient much sooner if necessary.  Total encounter time:20 minutes*in face-to-face visit time, chart review, lab review, care coordination, order entry, and documentation of the encounter time.    Alwin Baars, NP 12/01/23 10:50 AM Medical Oncology and Hematology [facility] [address] [place], [place] [address] Tel. [phone]    Fax. [phone]  *Total Encounter Time as defined by the Centers for Medicare and Medicaid Services includes, in addition to the face-to-face time of a patient visit (documented in the note above) non-face-to-face time: obtaining and reviewing outside history, ordering and reviewing medications, tests or procedures, care coordination (communications with other health care professionals or caregivers) and documentation in the medical record.

## 2023-12-02 ENCOUNTER — Other Ambulatory Visit: Payer: Self-pay | Admitting: Adult Health

## 2023-12-02 ENCOUNTER — Ambulatory Visit: Payer: Self-pay

## 2023-12-02 DIAGNOSIS — D500 Iron deficiency anemia secondary to blood loss (chronic): Secondary | ICD-10-CM

## 2023-12-02 NOTE — Progress Notes (Signed)
 Hey.  I saw her.  She didn't have iron  studies ordered, so we added them after her appointment and are calling her this morning to increase her iron  supplement to twice a day.  Thanks.  Loris Ros

## 2023-12-02 NOTE — Telephone Encounter (Signed)
 S/w pt and she is agreeable to this plan. Message sent to scheduling to call and schedule pt.

## 2023-12-02 NOTE — Telephone Encounter (Signed)
-----   Message from Conway Dennis sent at 12/02/2023  7:25 AM EDT ----- Please let patient know her iron  levels are down.  I recommend taking oral iron  twice a day and repeating labs in 12 weeks with phone visit 2 days later.  Please call patient with these  recommendations and send schedule message.  I will order labs.    Thanks, Loris Ros ----- Message ----- From: Dannis Dy, Lab In [place] Sent: 12/01/2023   1:01 PM EDT To: Percival Brace, NP

## 2023-12-04 ENCOUNTER — Encounter: Payer: Self-pay | Admitting: Hematology and Oncology

## 2024-03-02 ENCOUNTER — Inpatient Hospital Stay: Attending: Adult Health

## 2024-03-02 ENCOUNTER — Inpatient Hospital Stay

## 2024-03-02 DIAGNOSIS — D509 Iron deficiency anemia, unspecified: Secondary | ICD-10-CM | POA: Insufficient documentation

## 2024-03-02 DIAGNOSIS — D500 Iron deficiency anemia secondary to blood loss (chronic): Secondary | ICD-10-CM

## 2024-03-02 DIAGNOSIS — N920 Excessive and frequent menstruation with regular cycle: Secondary | ICD-10-CM | POA: Diagnosis not present

## 2024-03-02 LAB — CMP (CANCER CENTER ONLY)
ALT: 8 U/L (ref 0–44)
AST: 12 U/L — ABNORMAL LOW (ref 15–41)
Albumin: 4.3 g/dL (ref 3.5–5.0)
Alkaline Phosphatase: 50 U/L (ref 38–126)
Anion gap: 5 (ref 5–15)
BUN: 8 mg/dL (ref 6–20)
CO2: 25 mmol/L (ref 22–32)
Calcium: 9 mg/dL (ref 8.9–10.3)
Chloride: 106 mmol/L (ref 98–111)
Creatinine: 0.7 mg/dL (ref 0.44–1.00)
GFR, Estimated: >60 mL/min (ref >60)
Glucose, Bld: 78 mg/dL (ref 70–99)
Potassium: 3.8 mmol/L (ref 3.5–5.1)
Sodium: 136 mmol/L (ref 135–145)
Total Bilirubin: 0.3 mg/dL (ref 0.0–1.2)
Total Protein: 8 g/dL (ref 6.5–8.1)

## 2024-03-02 LAB — CBC WITH DIFFERENTIAL (CANCER CENTER ONLY)
Abs Immature Granulocytes: 0.02 K/uL (ref 0.00–0.07)
Basophils Absolute: 0 K/uL (ref 0.0–0.1)
Basophils Relative: 1 %
Eosinophils Absolute: 0.2 K/uL (ref 0.0–0.5)
Eosinophils Relative: 4 %
HCT: 30.9 % — ABNORMAL LOW (ref 36.0–46.0)
Hemoglobin: 9.3 g/dL — ABNORMAL LOW (ref 12.0–15.0)
Immature Granulocytes: 0 %
Lymphocytes Relative: 34 %
Lymphs Abs: 2 K/uL (ref 0.7–4.0)
MCH: 21.3 pg — ABNORMAL LOW (ref 26.0–34.0)
MCHC: 30.1 g/dL (ref 30.0–36.0)
MCV: 70.7 fL — ABNORMAL LOW (ref 80.0–100.0)
Monocytes Absolute: 0.4 K/uL (ref 0.1–1.0)
Monocytes Relative: 7 %
Neutro Abs: 3.2 K/uL (ref 1.7–7.7)
Neutrophils Relative %: 54 %
Platelet Count: 408 K/uL — ABNORMAL HIGH (ref 150–400)
RBC: 4.37 MIL/uL (ref 3.87–5.11)
RDW: 16.5 % — ABNORMAL HIGH (ref 11.5–15.5)
WBC Count: 5.9 K/uL (ref 4.0–10.5)
nRBC: 0 % (ref 0.0–0.2)

## 2024-03-02 LAB — IRON AND IRON BINDING CAPACITY (CC-WL,HP ONLY)
Iron: 22 ug/dL — ABNORMAL LOW (ref 28–170)
Saturation Ratios: 6 % — ABNORMAL LOW (ref 10.4–31.8)
TIBC: 385 ug/dL (ref 250–450)
UIBC: 363 ug/dL (ref 148–442)

## 2024-03-02 LAB — FERRITIN: Ferritin: 6 ng/mL — ABNORMAL LOW (ref 11–307)

## 2024-03-05 ENCOUNTER — Inpatient Hospital Stay (HOSPITAL_BASED_OUTPATIENT_CLINIC_OR_DEPARTMENT_OTHER): Admitting: Adult Health

## 2024-03-05 ENCOUNTER — Encounter: Payer: Self-pay | Admitting: Adult Health

## 2024-03-05 DIAGNOSIS — D500 Iron deficiency anemia secondary to blood loss (chronic): Secondary | ICD-10-CM | POA: Diagnosis not present

## 2024-03-05 NOTE — Progress Notes (Signed)
 [facility] Cancer Follow up:    Tanda Bleacher, MD [address] [place] [address]   DIAGNOSIS: iron  deficiency anemia  I connected with Joanna Hull on 03/05/24 at  9:20 AM EDT by telephone and verified that I am speaking with the correct person using two identifiers.  I discussed the limitations, risks, security and privacy concerns of performing an evaluation and management service by telephone and the availability of in person appointments.  I also discussed with the patient that there may be a patient responsible charge related to this service. The patient expressed understanding and agreed to proceed.  Patient location: Home Provider location: [facility] office   SUMMARY OF HEMATOLOGIC HISTORY: Referred to Dr. Loretha for iron  deficiency anemia related to heavy menstrual cycles.   IV iron  with Venofer  every 2 days x 5 in 05/2022 Oral iron  daily Following with gyn on Progesterone   CURRENT THERAPY: oral iron   INTERVAL HISTORY:  Discussed the use of AI scribe software for clinical note transcription with the patient, who gave verbal consent to proceed.  History of Present Illness Joanna Hull is a 33 year old female with chronic anemia who presents for evaluation of low hemoglobin and iron  levels.  Her hemoglobin is currently 9.3, with iron  saturation at 6, iron  level at 22, and ferritin at 6, all lower than previous measurements. She experiences heavy menstrual periods, which contribute to her anemia. She takes oral iron  supplements at night, but her hemoglobin levels have not improved satisfactorily. She is scheduled to discuss further management of her heavy periods with her OB on March 13, 2024. She has not previously received IV iron  infusions.     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  MEDICAL  HISTORY: No past medical history on file.  SURGICAL HISTORY: No past surgical history on file.  SOCIAL HISTORY: Social History   Socioeconomic History   Marital status: Single    Spouse name: Not on file   Number of children: Not on file   Years of education: Not on file   Highest education level: Not on file  Occupational History   Not on file  Tobacco Use   Smoking status: Never   Smokeless tobacco: Never  Substance and Sexual Activity   Alcohol use: Never   Drug use: Never   Sexual activity: Not on file  Other Topics Concern   Not on file  Social History Narrative   Not on file   Social Drivers of Health   Financial Resource Strain: Not on file  Food Insecurity: Not on file  Transportation Needs: Not on file  Physical Activity: Not on file  Stress: Not on file  Social Connections: Not on file  Intimate Partner Violence: Not on file    FAMILY HISTORY: Non-contributory  Review of Systems  Constitutional:  Negative for appetite change, chills, fatigue, fever and unexpected weight change.  HENT:   Negative for hearing loss, lump/mass and trouble swallowing.   Eyes:  Negative for eye problems and icterus.  Respiratory:  Negative for chest tightness, cough and shortness of breath.   Cardiovascular:  Negative for chest pain, leg swelling and palpitations.  Gastrointestinal:  Negative for abdominal distention, abdominal pain, constipation, diarrhea, nausea and vomiting.  Endocrine: Negative for hot flashes.  Genitourinary:  Negative for difficulty urinating.   Musculoskeletal:  Negative for arthralgias.  Skin:  Negative for itching and rash.  Neurological:  Negative for dizziness, extremity weakness, headaches and numbness.  Hematological:  Negative for adenopathy. Does not bruise/bleed easily.  Psychiatric/Behavioral:  Negative for depression. The patient is not nervous/anxious.       PHYSICAL EXAMINATION  Patient sounds well, in no apparent distress, mood  and behavior normal, speech normal, breathing is non labored LABORATORY DATA:  CBC    Component Value Date/Time   WBC 5.9 03/02/2024 1307   WBC 5.2 12/01/2023 0958   RBC 4.37 03/02/2024 1307   HGB 9.3 (L) 03/02/2024 1307   HGB 11.4 09/06/2022 1032   HCT 30.9 (L) 03/02/2024 1307   HCT 35.3 09/06/2022 1032   PLT 408 (H) 03/02/2024 1307   PLT 338 09/06/2022 1032   MCV 70.7 (L) 03/02/2024 1307   MCV 83 09/06/2022 1032   MCH 21.3 (L) 03/02/2024 1307   MCHC 30.1 03/02/2024 1307   RDW 16.5 (H) 03/02/2024 1307   RDW 14.8 09/06/2022 1032   LYMPHSABS 2.0 03/02/2024 1307   LYMPHSABS 1.8 09/06/2022 1032   MONOABS 0.4 03/02/2024 1307   EOSABS 0.2 03/02/2024 1307   EOSABS 0.2 09/06/2022 1032   BASOSABS 0.0 03/02/2024 1307   BASOSABS 0.0 09/06/2022 1032    CMP     Component Value Date/Time   NA 136 03/02/2024 1307   NA 139 09/06/2022 1032   K 3.8 03/02/2024 1307   CL 106 03/02/2024 1307   CO2 25 03/02/2024 1307   GLUCOSE 78 03/02/2024 1307   BUN 8 03/02/2024 1307   BUN 11 09/06/2022 1032   CREATININE 0.70 03/02/2024 1307   CALCIUM 9.0 03/02/2024 1307   PROT 8.0 03/02/2024 1307   PROT 6.9 09/06/2022 1032   ALBUMIN 4.3 03/02/2024 1307   ALBUMIN 3.9 09/06/2022 1032   AST 12 (L) 03/02/2024 1307   ALT 8 03/02/2024 1307   ALKPHOS 50 03/02/2024 1307   BILITOT 0.3 03/02/2024 1307   GFRNONAA >60 03/02/2024 1307     ASSESSMENT and THERAPY PLAN:   Assessment and Plan Assessment & Plan Iron  deficiency anemia due to chronic blood loss Chronic iron  deficiency anemia from heavy menstrual bleeding. Hemoglobin at 9.3 g/dL, iron  saturation 6%, serum iron  22 mcg/dL, ferritin 6 ng/mL. Oral iron  ineffective, causing gastrointestinal discomfort. - Administer IV iron  infusion every other day for five sessions. - Schedule follow-up labs and visit in 12 weeks to assess response. - Coordinate with schedulers for IV iron  sessions.   Follow up instructions:    -Return for IV iron   -Labs and  f/u in 12 weeks    The patient was provided an opportunity to ask questions and all were answered. The patient agreed with the plan and demonstrated an understanding of the instructions.   The patient was advised to call back or seek an in-person evaluation if the symptoms worsen or if the condition fails to improve as anticipated.   I provided 10 minutes of non face-to-face telephone visit time during this encounter, and > 50% was spent counseling as documented under my assessment & plan.    Morna Kendall, NP 03/05/24 9:41 AM Medical Oncology and Hematology [facility] [address] [place], [place] [address] Tel. [phone]    Fax. [phone]  *Total Encounter Time as defined by the Centers for Medicare and Medicaid Services includes, in addition to the face-to-face time of a patient visit (documented in the note above) non-face-to-face time: obtaining and reviewing outside history, ordering  and reviewing medications, tests or procedures, care coordination (communications with other health care professionals or caregivers) and documentation in the medical record.

## 2024-03-12 ENCOUNTER — Ambulatory Visit: Admitting: Obstetrics & Gynecology

## 2024-03-12 ENCOUNTER — Encounter: Payer: Self-pay | Admitting: Obstetrics & Gynecology

## 2024-03-12 VITALS — BP 144/89 | HR 90 | Ht 62.0 in | Wt 206.0 lb

## 2024-03-12 DIAGNOSIS — D219 Benign neoplasm of connective and other soft tissue, unspecified: Secondary | ICD-10-CM | POA: Diagnosis not present

## 2024-03-12 DIAGNOSIS — D500 Iron deficiency anemia secondary to blood loss (chronic): Secondary | ICD-10-CM

## 2024-03-12 DIAGNOSIS — N946 Dysmenorrhea, unspecified: Secondary | ICD-10-CM | POA: Diagnosis not present

## 2024-03-12 DIAGNOSIS — N920 Excessive and frequent menstruation with regular cycle: Secondary | ICD-10-CM

## 2024-03-12 NOTE — Progress Notes (Signed)
 Chief Complaint  Patient presents with   Fibroids      33 y.o. G0P0000 Patient's last menstrual period was 03/05/2024 (exact date). The current method of family planning is none.  Outpatient Encounter Medications as of 03/12/2024  Medication Sig   ferrous sulfate 325 (65 FE) MG tablet Take 325 mg by mouth daily with breakfast.   [DISCONTINUED] progesterone (PROMETRIUM) 100 MG capsule Take 100 mg by mouth daily.   No facility-administered encounter medications on file as of 03/12/2024.    Subjective Pt with known fibroid uterus On exam today 18 weeks size CT scan 2023 shows 10 cm fibroid predominantly but there are other smaller fibroids seen Periods are miserable Has to leave work, soils, uses pads + tampons, super overnight, diapers Hemoglboin today 8.6  Past Medical History:  Diagnosis Date   Anemia     History reviewed. No pertinent surgical history.  OB History     Gravida  0   Para  0   Term  0   Preterm  0   AB  0   Living  0      SAB  0   IAB  0   Ectopic  0   Multiple  0   Live Births  0           No Known Allergies  Social History   Socioeconomic History   Marital status: Single    Spouse name: Not on file   Number of children: Not on file   Years of education: Not on file   Highest education level: Not on file  Occupational History   Not on file  Tobacco Use   Smoking status: Never   Smokeless tobacco: Never  Vaping Use   Vaping status: Never Used  Substance and Sexual Activity   Alcohol use: Never   Drug use: Never   Sexual activity: Yes    Birth control/protection: None  Other Topics Concern   Not on file  Social History Narrative   Not on file   Social Drivers of Health   Financial Resource Strain: Not on file  Food Insecurity: Not on file  Transportation Needs: Not on file  Physical Activity: Not on file  Stress: Not on file  Social Connections: Not on file    Family History  Problem Relation  Age of Onset   Colon cancer Father    Stroke Father    Stroke Mother    Diabetes Mother    Hypertension Mother     Medications:       Current Outpatient Medications:    ferrous sulfate 325 (65 FE) MG tablet, Take 325 mg by mouth daily with breakfast., Disp: , Rfl:   Objective Blood pressure (!) 144/89, pulse 90, height 5' 2 (1.575 m), weight 206 lb (93.4 kg), last menstrual period 03/05/2024.  General WDWN female NAD Vulva:  normal appearing vulva with no masses, tenderness or lesions Vagina:  normal mucosa, no discharge Cervix:  Normal no lesions Uterus:  18 weeks size, measures 10 cm on CT scan Adnexa: ovaries:present,  normal adnexa in size, nontender and no masses   Pertinent ROS No burning with urination, frequency or urgency No nausea, vomiting or diarrhea Nor fever chills or other constitutional symptoms    Labs or studies Reviewed CLINICAL DATA:  Lower back pain since Thursday. Left lower quadrant pain since Saturday   EXAM: CT ABDOMEN AND PELVIS WITH CONTRAST   TECHNIQUE: Multidetector CT imaging of the  abdomen and pelvis was performed using the standard protocol following bolus administration of intravenous contrast.   RADIATION DOSE REDUCTION: This exam was performed according to the departmental dose-optimization program which includes automated exposure control, adjustment of the mA and/or kV according to patient size and/or use of iterative reconstruction technique.   CONTRAST:  OMNIPAQUE  IOHEXOL  300 MG/ML  SOLN   COMPARISON:  None Available.   FINDINGS: Lower chest:  No contributory findings.   Hepatobiliary: No focal liver abnormality.No evidence of biliary obstruction or stone.   Pancreas: Unremarkable.   Spleen: Unremarkable.   Adrenals/Urinary Tract: Negative adrenals. No hydronephrosis or stone. Unremarkable bladder.   Stomach/Bowel:  No obstruction. No appendicitis.   Vascular/Lymphatic: No acute vascular abnormality. No  mass or adenopathy.   Reproductive:Globular enlargement of the uterus with heterogeneous enhancement of the body, greater thickening of the ventral body although no discrete measurable mass is seen. No history of recent pregnancy.   Other: No ascites or pneumoperitoneum.   Musculoskeletal: No acute abnormalities.   IMPRESSION: 1. No acute finding. 2. Large uterus, assuming no recent delivery favor adenomyosis. Cannot exclude fibroids given asymmetric thickening of the ventral body.     Electronically Signed   By: Joanna Hull M.D.   On: 04/26/2022 05:37     Impression + Management Plan: Diagnoses this Encounter::   ICD-10-CM   1. Fibroids: 18 weeks size on exam, 10 cm noted on CT scan  D21.9     2. Iron  deficiency anemia due to chronic blood loss: menorrhagia  D50.0     3. Menorrhagia with regular cycle  N92.0     4. Dysmenorrhea  N94.6       Joanna Hull,   Please schedule Joanna Hull for surgery and contact her regarding pre op dates/times, etc::  Procedure:  robot assisted total laparoscopic hysterectomy with removal of both tubes  Diagnosis:  18 week fibroid uterus, menorrhagia dysmenorrhea anemia  Date or dates requested:  05/02/24 [facility]  Thanks,  Dr Jayne  Medications prescribed during  this encounter: No orders of the defined types were placed in this encounter.   Labs or Scans Ordered during this encounter: No orders of the defined types were placed in this encounter.     Follow up Return in about 2 months (around 05/11/2024) for Post Op, with Dr Jayne.

## 2024-03-13 ENCOUNTER — Encounter: Payer: Self-pay | Admitting: Obstetrics & Gynecology

## 2024-03-15 ENCOUNTER — Encounter: Payer: Self-pay | Admitting: Adult Health

## 2024-03-15 ENCOUNTER — Other Ambulatory Visit: Payer: Self-pay | Admitting: Adult Health

## 2024-03-15 NOTE — Addendum Note (Signed)
 Addended by: CRAWFORD MORNA BROCKS on: 03/15/2024 01:14 PM   Modules accepted: Orders

## 2024-03-28 ENCOUNTER — Telehealth: Payer: Self-pay

## 2024-03-28 ENCOUNTER — Ambulatory Visit

## 2024-03-28 NOTE — Telephone Encounter (Signed)
 Morna, patient will be scheduled as soon as possible.  Auth Submission: NO AUTH NEEDED Site of care: Site of care: [facility] Payer: [employer] commercial Medication & CPT/J Code(s) submitted: Venofer  (Iron  Sucrose) J1756 Diagnosis Code:  Route of submission (phone, fax, portal):  Phone # Fax # Auth type: Buy/Bill PB Units/visits requested: 200mg  x 5 doses Reference number:  Approval from: 03/28/24 to 06/20/24

## 2024-03-30 ENCOUNTER — Ambulatory Visit

## 2024-03-30 VITALS — BP 120/82 | HR 74 | Temp 98.1°F | Resp 16 | Ht 61.0 in | Wt 206.4 lb

## 2024-03-30 DIAGNOSIS — D500 Iron deficiency anemia secondary to blood loss (chronic): Secondary | ICD-10-CM | POA: Diagnosis not present

## 2024-03-30 DIAGNOSIS — N920 Excessive and frequent menstruation with regular cycle: Secondary | ICD-10-CM

## 2024-03-30 MED ORDER — IRON SUCROSE 20 MG/ML IV SOLN
200.0000 mg | Freq: Once | INTRAVENOUS | Status: AC
  Administered 2024-03-30: 200 mg via INTRAVENOUS
  Filled 2024-03-30: qty 10

## 2024-03-30 NOTE — Progress Notes (Signed)
 Diagnosis: Iron Deficiency Anemia  Provider:  Chilton Greathouse MD  Procedure: IV Push  IV Type: Peripheral, IV Location: L Antecubital  Venofer (Iron Sucrose), Dose: 200 mg  Post Infusion IV Care: Observation period completed and Peripheral IV Discontinued  Discharge: Condition: Good, Destination: Home . AVS Declined  Performed by:  Forrest Moron, RN

## 2024-04-06 ENCOUNTER — Ambulatory Visit (INDEPENDENT_AMBULATORY_CARE_PROVIDER_SITE_OTHER)

## 2024-04-06 VITALS — BP 136/87 | HR 77 | Temp 98.3°F | Resp 16 | Ht 61.0 in | Wt 205.6 lb

## 2024-04-06 DIAGNOSIS — D500 Iron deficiency anemia secondary to blood loss (chronic): Secondary | ICD-10-CM | POA: Diagnosis not present

## 2024-04-06 DIAGNOSIS — N920 Excessive and frequent menstruation with regular cycle: Secondary | ICD-10-CM

## 2024-04-06 MED ORDER — IRON SUCROSE 20 MG/ML IV SOLN
200.0000 mg | Freq: Once | INTRAVENOUS | Status: AC
  Administered 2024-04-06: 200 mg via INTRAVENOUS
  Filled 2024-04-06: qty 10

## 2024-04-06 NOTE — Progress Notes (Signed)
 Diagnosis: Iron  Deficiency Anemia  Provider:  Praveen Mannam MD  Procedure: IV Push  IV Type: Peripheral, IV Location: L Antecubital  Venofer  (Iron  Sucrose), Dose: 200 mg  Post Infusion IV Care: Patient declined observation  Discharge: Condition: Good, Destination: Home . AVS Declined  Performed by:  Eleanor DELENA Bloch, RN

## 2024-04-09 ENCOUNTER — Ambulatory Visit (INDEPENDENT_AMBULATORY_CARE_PROVIDER_SITE_OTHER): Admitting: *Deleted

## 2024-04-09 VITALS — BP 118/83 | HR 68 | Temp 98.1°F | Resp 16 | Ht 61.0 in | Wt 205.4 lb

## 2024-04-09 DIAGNOSIS — D500 Iron deficiency anemia secondary to blood loss (chronic): Secondary | ICD-10-CM

## 2024-04-09 MED ORDER — IRON SUCROSE 20 MG/ML IV SOLN
200.0000 mg | Freq: Once | INTRAVENOUS | Status: AC
  Administered 2024-04-09: 200 mg via INTRAVENOUS
  Filled 2024-04-09: qty 10

## 2024-04-09 NOTE — Progress Notes (Signed)
 Diagnosis: Iron Deficiency Anemia  Provider:  Chilton Greathouse MD  Procedure: IV Push  IV Type: Peripheral, IV Location: L Antecubital  Venofer (Iron Sucrose), Dose: 200 mg  Post Infusion IV Care: Observation period completed and Peripheral IV Discontinued  Discharge: Condition: Good, Destination: Home . AVS Declined  Performed by:  Forrest Moron, RN

## 2024-04-11 ENCOUNTER — Ambulatory Visit (INDEPENDENT_AMBULATORY_CARE_PROVIDER_SITE_OTHER)

## 2024-04-11 VITALS — BP 128/85 | HR 84 | Temp 98.4°F | Resp 16 | Ht 61.0 in | Wt 208.0 lb

## 2024-04-11 DIAGNOSIS — D500 Iron deficiency anemia secondary to blood loss (chronic): Secondary | ICD-10-CM

## 2024-04-11 MED ORDER — IRON SUCROSE 20 MG/ML IV SOLN
200.0000 mg | Freq: Once | INTRAVENOUS | Status: AC
  Administered 2024-04-11: 200 mg via INTRAVENOUS

## 2024-04-11 NOTE — Progress Notes (Signed)
 Diagnosis: Iron Deficiency Anemia  Provider:  Chilton Greathouse MD  Procedure: IV Push  IV Type: Peripheral, IV Location: L Antecubital  Venofer (Iron Sucrose), Dose: 200 mg  Post Infusion IV Care: Patient declined observation and Peripheral IV Discontinued  Discharge: Condition: Good, Destination: Home . AVS Declined  Performed by:  Loney Hering, LPN

## 2024-04-13 ENCOUNTER — Ambulatory Visit (INDEPENDENT_AMBULATORY_CARE_PROVIDER_SITE_OTHER)

## 2024-04-13 VITALS — BP 123/84 | HR 72 | Temp 98.6°F | Resp 14 | Ht 61.0 in | Wt 209.8 lb

## 2024-04-13 DIAGNOSIS — D500 Iron deficiency anemia secondary to blood loss (chronic): Secondary | ICD-10-CM | POA: Diagnosis not present

## 2024-04-13 DIAGNOSIS — N920 Excessive and frequent menstruation with regular cycle: Secondary | ICD-10-CM

## 2024-04-13 MED ORDER — IRON SUCROSE 20 MG/ML IV SOLN
200.0000 mg | Freq: Once | INTRAVENOUS | Status: AC
  Administered 2024-04-13: 200 mg via INTRAVENOUS
  Filled 2024-04-13: qty 10

## 2024-04-13 NOTE — Progress Notes (Signed)
 Diagnosis: Iron Deficiency Anemia  Provider:  Chilton Greathouse MD  Procedure: IV Push  IV Type: Peripheral, IV Location: L Antecubital  Venofer (Iron Sucrose), Dose: 200 mg  Post Infusion IV Care: Patient declined observation and Peripheral IV Discontinued  Discharge: Condition: Good, Destination: Home . AVS Declined  Performed by:  Marlow Baars [place], RN

## 2024-04-27 NOTE — Patient Instructions (Signed)
 Joanna Hull  04/27/2024     @PREFPERIOPPHARMACY @   Your procedure is scheduled on 05/02/2024.   Report to [facility] at  0600 A.M.   Call this number if you have problems the morning of surgery:  [phone]  If you experience any cold or flu symptoms such as cough, fever, chills, shortness of breath, etc. between now and your scheduled surgery, please notify us  at the above number.   Remember:  Do not eat after midnight.   You may drink clear liquids until 0330 am on 05/02/2024.      Clear liquids allowed are:                    Water, Carbonated beverages (diabetics please choose diet or no sugar options), Black Coffee Only (No creamer, milk or cream, including half & half and powdered creamer), and Clear Sports drink (No red color; diabetics please choose diet or no sugar options)    Take these medicines the morning of surgery with A SIP OF WATER                                                     None.      Do not wear jewelry, make-up or nail polish, including gel polish,  artificial nails, or any other type of covering on natural nails (fingers and  toes).  Do not wear lotions, powders, or perfumes, or deodorant.  Do not shave 48 hours prior to surgery.  Men may shave face and neck.  Do not bring valuables to the hospital.  [facility] is not responsible for any belongings or valuables.  Contacts, dentures or bridgework may not be worn into surgery.  Leave your suitcase in the car.  After surgery it may be brought to your room.  For patients admitted to the hospital, discharge time will be determined by your treatment team.  Patients discharged the day of surgery will not be allowed to drive home and must have someone with them for 24 hours.    Special instructions:   DO NOT smoke tobacco or vape for 24 hours before your procedure.  Please read over the following fact sheets that you were given. Pain Booklet, Coughing and Deep Breathing, Blood  Transfusion Information, Surgical Site Infection Prevention, Anesthesia Post-op Instructions, and Care and Recovery After Surgery       Laparoscopically Assisted Vaginal Hysterectomy, Care After After a LAVH, it's common to have soreness and numbness in your incision areas. You'll have pain in your belly. You may also have: Bleeding and discharge from the vagina. Tiredness. Sadness and other emotions. If your ovaries were taken out, you may also have symptoms of menopause, such as hot flashes, night sweats, and lack of sleep. Follow these instructions at home: Medicines Take over-the-counter and prescription medicines only as told by your health care provider. If you were given antibiotics, take them as told by your provider. Do not stop taking the antibiotic even if you start to feel better. Ask your provider if the medicine prescribed to you: Requires you to avoid driving or using machinery. Can cause constipation. You may need to take these actions to prevent or treat constipation: Drink enough fluid to keep your pee (urine) pale yellow. Take over-the-counter or prescription medicines. Eat foods that are  high in fiber, such as beans, whole grains, and fresh fruits and vegetables. Limit foods that are high in fat and processed sugars, such as fried or sweet foods. Incision care  Follow instructions from your provider about how to take care of your incisions. Make sure you: Wash your hands with soap and water for at least 20 seconds before and after you change your bandage. If soap and water aren't available, use hand sanitizer. Change your dressing as told by your provider. Leave stitches, skin glue, or tape strips in place. These skin closures may need to stay in place for 2 weeks or longer. If tape strip edges start to loosen and curl up, you may trim the loose edges. Do not remove tape strips completely unless your provider tells you to do that. Check your incision areas every day  for signs of infection. Check for: More redness, swelling, or pain. More fluid or blood. Warmth. Pus or a bad smell. Activity  Rest as told by your provider. Do not sit for a long time without moving. Get up to take short walks every 1-2 hours. This will improve blood flow and breathing. Ask for help if you feel weak or unsteady. You may have to avoid lifting. Ask your provider how much you can safely lift. Return to your normal activities as told by your provider. Ask your provider what activities are safe for you. Lifestyle Do not use any products that contain nicotine or tobacco. These products include cigarettes, chewing tobacco, and vaping devices, such as e-cigarettes. These can delay healing after surgery. If you need help quitting, ask your provider. Do not drink alcohol until your provider approves. General instructions Do not douche, use tampons, have sex, or put anything in the vagina for at least 6 weeks. If you struggle with physical or emotional changes after your procedure, speak with your provider or a therapist. Do not take baths, swim, or use a hot tub until your provider approves. Ask your provider if you may take showers. You may only be allowed to take sponge baths. Try to have someone at home with you for the first 1-2 weeks to help with your daily chores. Wear compression stockings as told by your provider. These stockings help to prevent blood clots and reduce swelling in your legs. Your provider may give you more instructions. Make sure you know what you can and can't do. Contact a health care provider if: You have any signs of infection. You have pain and your pain medicine doesn't help. You feel dizzy or light-headed. You have trouble peeing. You vomit or feel like you may vomit, and the symptoms do not go away. You have pus or discharge from your vagina that smells bad. Get help right away if: You have a fever and your symptoms suddenly get worse. You have  very bad pain in the abdomen. You have chest pain or shortness of breath. You faint. You have pain, swelling, or redness in your leg. You have heavy bleeding in your vagina that soaks through a pad in less than 1 hour. You see blood clots in your bleeding. These symptoms may be an emergency. Get help right away. Call 911. Do not wait to see if the symptoms will go away. Do not drive yourself to the hospital. This information is not intended to replace advice given to you by your health care provider. Make sure you discuss any questions you have with your health care provider. Document Revised: 09/17/2022 Document Reviewed: 09/17/2022  Elsevier Patient Education  2024 [employer] General Anesthesia, Adult, Care After The following information offers guidance on how to care for yourself after your procedure. Your health care provider may also give you more specific instructions. If you have problems or questions, contact your health care provider. What can I expect after the procedure? After the procedure, it is common for people to: Have pain or discomfort at the IV site. Have nausea or vomiting. Have a sore throat or hoarseness. Have trouble concentrating. Feel cold or chills. Feel weak, sleepy, or tired (fatigue). Have soreness and body aches. These can affect parts of the body that were not involved in surgery. Follow these instructions at home: For the time period you were told by your health care provider:  Rest. Do not participate in activities where you could fall or become injured. Do not drive or use machinery. Do not drink alcohol. Do not take sleeping pills or medicines that cause drowsiness. Do not make important decisions or sign legal documents. Do not take care of children on your own. General instructions Drink enough fluid to keep your urine pale yellow. If you have sleep apnea, surgery and certain medicines can increase your risk for breathing problems. Follow  instructions from your health care provider about wearing your sleep device: Anytime you are sleeping, including during daytime naps. While taking prescription pain medicines, sleeping medicines, or medicines that make you drowsy. Return to your normal activities as told by your health care provider. Ask your health care provider what activities are safe for you. Take over-the-counter and prescription medicines only as told by your health care provider. Do not use any products that contain nicotine or tobacco. These products include cigarettes, chewing tobacco, and vaping devices, such as e-cigarettes. These can delay incision healing after surgery. If you need help quitting, ask your health care provider. Contact a health care provider if: You have nausea or vomiting that does not get better with medicine. You vomit every time you eat or drink. You have pain that does not get better with medicine. You cannot urinate or have bloody urine. You develop a skin rash. You have a fever. Get help right away if: You have trouble breathing. You have chest pain. You vomit blood. These symptoms may be an emergency. Get help right away. Call 911. Do not wait to see if the symptoms will go away. Do not drive yourself to the hospital. Summary After the procedure, it is common to have a sore throat, hoarseness, nausea, vomiting, or to feel weak, sleepy, or fatigue. For the time period you were told by your health care provider, do not drive or use machinery. Get help right away if you have difficulty breathing, have chest pain, or vomit blood. These symptoms may be an emergency. This information is not intended to replace advice given to you by your health care provider. Make sure you discuss any questions you have with your health care provider. Document Revised: 09/04/2021 Document Reviewed: 09/04/2021 Elsevier Patient Education  2024 Else[employer] Inc.How to Use Chlorhexidine at Home in the  Shower Chlorhexidine gluconate (CHG) is a germ-killing (antiseptic) wash that's used to clean the skin. It can get rid of the germs that normally live on the skin and can keep them away for about 24 hours. If you're having surgery, you may be told to shower with CHG at home the night before surgery. This can help lower your risk for infection. To use CHG wash in the shower, follow the steps  below. Supplies needed: CHG body wash. Clean washcloth. Clean towel. How to use CHG in the shower Follow these steps unless you're told to use CHG in a different way: Start the shower. Use your normal soap and shampoo to wash your face and hair. Turn off the shower or move out of the shower stream. Pour CHG onto a clean washcloth. Do not use any type of brush or rough sponge. Start at your neck, washing your body down to your toes. Make sure you: Wash the part of your body where the surgery will be done for at least 1 minute. Do not scrub. Do not use CHG on your head or face unless your health care provider tells you to. If it gets into your ears or eyes, rinse them well with water. Do not wash your genitals with CHG. Wash your back and under your arms. Make sure to wash skin folds. Let the CHG sit on your skin for 1-2 minutes or as long as told. Rinse your entire body in the shower, including all body creases and folds. Turn off the shower. Dry off with a clean towel. Do not put anything on your skin afterward, such as powder, lotion, or perfume. Put on clean clothes or pajamas. If it's the night before surgery, sleep in clean sheets. General tips Use CHG only as told, and follow the instructions on the label. Use the full amount of CHG as told. This is often one bottle. Do not smoke and stay away from flames after using CHG. Your skin may feel sticky after using CHG. This is normal. The sticky feeling will go away as the CHG dries. Do not use CHG: If you have a chlorhexidine allergy or have  reacted to chlorhexidine in the past. On open wounds or areas of skin that have broken skin, cuts, or scrapes. On babies younger than 37 months of age. Contact a health care provider if: You have questions about using CHG. Your skin gets irritated or itchy. You have a rash after using CHG. You swallow any CHG. Call your local poison control center [phone] in the [place] Your eyes itch badly, or they become very red or swollen. Your hearing changes. You have trouble seeing. If you can't reach your provider, go to an urgent care or emergency room. Do not drive yourself. Get help right away if: You have swelling or tingling in your mouth or throat. You make high-pitched whistling sounds when you breathe, most often when you breathe out (wheeze). You have trouble breathing. These symptoms may be an emergency. Call 911 right away. Do not wait to see if the symptoms will go away. Do not drive yourself to the hospital. This information is not intended to replace advice given to you by your health care provider. Make sure you discuss any questions you have with your health care provider. Document Revised: 12/21/2022 Document Reviewed: 12/17/2021 Elsevier Patient Education  2024 [employer].

## 2024-04-30 ENCOUNTER — Other Ambulatory Visit (HOSPITAL_COMMUNITY): Payer: Self-pay | Admitting: Obstetrics & Gynecology

## 2024-04-30 ENCOUNTER — Encounter (HOSPITAL_COMMUNITY)
Admission: RE | Admit: 2024-04-30 | Discharge: 2024-04-30 | Disposition: A | Source: Ambulatory Visit | Attending: Obstetrics & Gynecology

## 2024-04-30 ENCOUNTER — Encounter (HOSPITAL_COMMUNITY): Payer: Self-pay

## 2024-04-30 VITALS — Ht 61.0 in | Wt 210.0 lb

## 2024-04-30 DIAGNOSIS — Z01812 Encounter for preprocedural laboratory examination: Secondary | ICD-10-CM | POA: Insufficient documentation

## 2024-04-30 DIAGNOSIS — Z01818 Encounter for other preprocedural examination: Secondary | ICD-10-CM | POA: Diagnosis present

## 2024-04-30 DIAGNOSIS — D500 Iron deficiency anemia secondary to blood loss (chronic): Secondary | ICD-10-CM | POA: Diagnosis not present

## 2024-04-30 LAB — COMPREHENSIVE METABOLIC PANEL WITH GFR
ALT: 35 U/L (ref 0–44)
AST: 36 U/L (ref 15–41)
Albumin: 4.2 g/dL (ref 3.5–5.0)
Alkaline Phosphatase: 59 U/L (ref 38–126)
Anion gap: 10 (ref 5–15)
BUN: 10 mg/dL (ref 6–20)
CO2: 25 mmol/L (ref 22–32)
Calcium: 9.1 mg/dL (ref 8.9–10.3)
Chloride: 104 mmol/L (ref 98–111)
Creatinine, Ser: 0.64 mg/dL (ref 0.44–1.00)
GFR, Estimated: >60 mL/min (ref >60)
Glucose, Bld: 81 mg/dL (ref 70–99)
Potassium: 3.7 mmol/L (ref 3.5–5.1)
Sodium: 139 mmol/L (ref 135–145)
Total Bilirubin: 0.3 mg/dL (ref 0.0–1.2)
Total Protein: 7.6 g/dL (ref 6.5–8.1)

## 2024-04-30 LAB — URINALYSIS, ROUTINE W REFLEX MICROSCOPIC
Bilirubin Urine: NEGATIVE
Glucose, UA: NEGATIVE mg/dL
Hgb urine dipstick: NEGATIVE
Ketones, ur: NEGATIVE mg/dL
Leukocytes,Ua: NEGATIVE
Nitrite: NEGATIVE
Protein, ur: NEGATIVE mg/dL
Specific Gravity, Urine: 1.021 (ref 1.005–1.030)
pH: 5 (ref 5.0–8.0)

## 2024-04-30 LAB — CBC WITH DIFFERENTIAL/PLATELET
Abs Immature Granulocytes: 0.01 K/uL (ref 0.00–0.07)
Basophils Absolute: 0 K/uL (ref 0.0–0.1)
Basophils Relative: 1 %
Eosinophils Absolute: 0.1 K/uL (ref 0.0–0.5)
Eosinophils Relative: 3 %
HCT: 35.7 % — ABNORMAL LOW (ref 36.0–46.0)
Hemoglobin: 10.7 g/dL — ABNORMAL LOW (ref 12.0–15.0)
Immature Granulocytes: 0 %
Lymphocytes Relative: 36 %
Lymphs Abs: 1.4 K/uL (ref 0.7–4.0)
MCH: 24.1 pg — ABNORMAL LOW (ref 26.0–34.0)
MCHC: 30 g/dL (ref 30.0–36.0)
MCV: 80.4 fL (ref 80.0–100.0)
Monocytes Absolute: 0.3 K/uL (ref 0.1–1.0)
Monocytes Relative: 6 %
Neutro Abs: 2.1 K/uL (ref 1.7–7.7)
Neutrophils Relative %: 54 %
Platelets: 310 K/uL (ref 150–400)
RBC: 4.44 MIL/uL (ref 3.87–5.11)
Smear Review: NORMAL
WBC: 4 K/uL (ref 4.0–10.5)
nRBC: 0 % (ref 0.0–0.2)

## 2024-04-30 LAB — TYPE AND SCREEN
ABO/RH(D): O POS
Antibody Screen: NEGATIVE

## 2024-04-30 LAB — RAPID HIV SCREEN (HIV 1/2 AB+AG)
HIV 1/2 Antibodies: NONREACTIVE
HIV-1 P24 Antigen - HIV24: NONREACTIVE

## 2024-04-30 LAB — POCT PREGNANCY, URINE: Preg Test, Ur: NEGATIVE

## 2024-05-02 ENCOUNTER — Other Ambulatory Visit: Payer: Self-pay

## 2024-05-02 ENCOUNTER — Encounter (HOSPITAL_COMMUNITY)
Admission: RE | Disposition: A | Discharge status: Home / Self Care | Payer: Self-pay | Source: Home / Self Care | Attending: Obstetrics & Gynecology

## 2024-05-02 ENCOUNTER — Ambulatory Visit (HOSPITAL_COMMUNITY): Admitting: Certified Registered Nurse Anesthetist

## 2024-05-02 ENCOUNTER — Encounter (HOSPITAL_COMMUNITY): Payer: Self-pay | Admitting: Obstetrics & Gynecology

## 2024-05-02 ENCOUNTER — Ambulatory Visit (HOSPITAL_COMMUNITY)
Admission: RE | Admit: 2024-05-02 | Discharge: 2024-05-02 | Disposition: A | Discharge status: Home / Self Care | Attending: Obstetrics & Gynecology | Admitting: Obstetrics & Gynecology

## 2024-05-02 DIAGNOSIS — I10 Essential (primary) hypertension: Secondary | ICD-10-CM | POA: Diagnosis not present

## 2024-05-02 DIAGNOSIS — N888 Other specified noninflammatory disorders of cervix uteri: Secondary | ICD-10-CM

## 2024-05-02 DIAGNOSIS — D259 Leiomyoma of uterus, unspecified: Secondary | ICD-10-CM

## 2024-05-02 DIAGNOSIS — D219 Benign neoplasm of connective and other soft tissue, unspecified: Secondary | ICD-10-CM | POA: Diagnosis present

## 2024-05-02 DIAGNOSIS — N920 Excessive and frequent menstruation with regular cycle: Secondary | ICD-10-CM

## 2024-05-02 DIAGNOSIS — N946 Dysmenorrhea, unspecified: Secondary | ICD-10-CM

## 2024-05-02 DIAGNOSIS — D500 Iron deficiency anemia secondary to blood loss (chronic): Secondary | ICD-10-CM | POA: Diagnosis present

## 2024-05-02 DIAGNOSIS — N8003 Adenomyosis of the uterus: Secondary | ICD-10-CM

## 2024-05-02 HISTORY — PX: HYSTERECTOMY,TOTAL,BILAT SALPINGO-OOPHORECTOMY, ROBOT, LAP: SHX7359

## 2024-05-02 LAB — ABO/RH: ABO/RH(D): O POS

## 2024-05-02 SURGERY — HYSTERECTOMY, TOTAL, BILATERAL SAPLINGO-OOPHORECTOMY, ROBOT ASSISTED, LAPAROSCOPIC, D5
Anesthesia: General | Site: Abdomen

## 2024-05-02 MED ORDER — LACTATED RINGERS IV SOLN: INTRAVENOUS | Status: DC | PRN

## 2024-05-02 MED ORDER — FENTANYL CITRATE (PF) 100 MCG/2ML IJ SOLN
INTRAMUSCULAR | Status: DC | PRN
  Administered 2024-05-02 (×4): 50 ug via INTRAVENOUS
  Administered 2024-05-02: 100 ug via INTRAVENOUS
  Administered 2024-05-02: 50 ug via INTRAVENOUS

## 2024-05-02 MED ORDER — DEXAMETHASONE SOD PHOSPHATE PF 10 MG/ML IJ SOLN
INTRAMUSCULAR | Status: DC | PRN
  Administered 2024-05-02: 10 mg via INTRAVENOUS

## 2024-05-02 MED ORDER — CHLORHEXIDINE GLUCONATE 0.12 % MT SOLN
OROMUCOSAL | Status: AC
  Filled 2024-05-02: qty 45

## 2024-05-02 MED ORDER — LACTATED RINGERS IV SOLN: INTRAVENOUS | Status: DC

## 2024-05-02 MED ORDER — MIDAZOLAM HCL (PF) 2 MG/2ML IJ SOLN
INTRAMUSCULAR | Status: DC | PRN
  Administered 2024-05-02: 2 mg via INTRAVENOUS

## 2024-05-02 MED ORDER — CIPROFLOXACIN HCL 500 MG PO TABS: 500.0000 mg | ORAL_TABLET | Freq: Two times a day (BID) | ORAL | 0 refills | Status: AC

## 2024-05-02 MED ORDER — ROCURONIUM BROMIDE 10 MG/ML (PF) SYRINGE
PREFILLED_SYRINGE | INTRAVENOUS | Status: AC
  Filled 2024-05-02: qty 10

## 2024-05-02 MED ORDER — KETOROLAC TROMETHAMINE 10 MG PO TABS: 10.0000 mg | ORAL_TABLET | Freq: Three times a day (TID) | ORAL | 0 refills | Status: AC | PRN

## 2024-05-02 MED ORDER — PROPOFOL 500 MG/50ML IV EMUL
INTRAVENOUS | Status: AC
  Filled 2024-05-02: qty 100

## 2024-05-02 MED ORDER — HYDROMORPHONE HCL 1 MG/ML IJ SOLN
INTRAMUSCULAR | Status: AC
  Filled 2024-05-02: qty 0.5

## 2024-05-02 MED ORDER — HYDROMORPHONE HCL 1 MG/ML IJ SOLN
INTRAMUSCULAR | Status: DC | PRN
  Administered 2024-05-02 (×2): .5 mg via INTRAVENOUS

## 2024-05-02 MED ORDER — BUPIVACAINE HCL (PF) 0.25 % IJ SOLN
INTRAMUSCULAR | Status: AC
  Filled 2024-05-02: qty 60

## 2024-05-02 MED ORDER — PROPOFOL 10 MG/ML IV BOLUS
INTRAVENOUS | Status: AC
  Filled 2024-05-02: qty 20

## 2024-05-02 MED ORDER — OXYCODONE-ACETAMINOPHEN 7.5-325 MG PO TABS: 1.0000 | ORAL_TABLET | Freq: Four times a day (QID) | ORAL | 0 refills | Status: AC | PRN

## 2024-05-02 MED ORDER — FENTANYL CITRATE (PF) 250 MCG/5ML IJ SOLN
INTRAMUSCULAR | Status: AC
  Filled 2024-05-02: qty 5

## 2024-05-02 MED ORDER — MIDAZOLAM HCL 2 MG/2ML IJ SOLN
INTRAMUSCULAR | Status: AC
  Filled 2024-05-02: qty 2

## 2024-05-02 MED ORDER — POVIDONE-IODINE 10 % EX SWAB: 2.0000 | Freq: Once | CUTANEOUS | Status: DC

## 2024-05-02 MED ORDER — OXYCODONE HCL 5 MG PO TABS: 5.0000 mg | ORAL_TABLET | Freq: Once | ORAL | Status: DC | PRN

## 2024-05-02 MED ORDER — CEFAZOLIN SODIUM-DEXTROSE 2-4 GM/100ML-% IV SOLN
2.0000 g | INTRAVENOUS | Status: AC
  Administered 2024-05-02: 2 g via INTRAVENOUS
  Filled 2024-05-02: qty 100

## 2024-05-02 MED ORDER — STERILE WATER FOR IRRIGATION IR SOLN
Status: DC | PRN
Start: 2024-05-02 — End: 2024-05-02
  Administered 2024-05-02: 1000 mL

## 2024-05-02 MED ORDER — SUGAMMADEX SODIUM 200 MG/2ML IV SOLN
INTRAVENOUS | Status: DC | PRN
  Administered 2024-05-02: 400 mg via INTRAVENOUS

## 2024-05-02 MED ORDER — LIDOCAINE 2% (20 MG/ML) 5 ML SYRINGE
INTRAMUSCULAR | Status: AC
  Filled 2024-05-02: qty 5

## 2024-05-02 MED ORDER — BUPIVACAINE HCL 0.25 % IJ SOLN
INTRAMUSCULAR | Status: DC | PRN
  Administered 2024-05-02: 60 mL

## 2024-05-02 MED ORDER — KETOROLAC TROMETHAMINE 30 MG/ML IJ SOLN
30.0000 mg | INTRAMUSCULAR | Status: AC
  Administered 2024-05-02: 30 mg via INTRAVENOUS
  Filled 2024-05-02: qty 1

## 2024-05-02 MED ORDER — CHLORHEXIDINE GLUCONATE 0.12 % MT SOLN: 15.0000 mL | Freq: Once | OROMUCOSAL | Status: DC

## 2024-05-02 MED ORDER — ORAL CARE MOUTH RINSE: 15.0000 mL | Freq: Once | OROMUCOSAL | Status: DC

## 2024-05-02 MED ORDER — FENTANYL CITRATE (PF) 100 MCG/2ML IJ SOLN
INTRAMUSCULAR | Status: AC
  Filled 2024-05-02: qty 2

## 2024-05-02 MED ORDER — ONDANSETRON HCL 4 MG/2ML IJ SOLN
INTRAMUSCULAR | Status: DC | PRN
  Administered 2024-05-02: 4 mg via INTRAVENOUS

## 2024-05-02 MED ORDER — ONDANSETRON 8 MG PO TBDP: 8.0000 mg | ORAL_TABLET | Freq: Three times a day (TID) | ORAL | 0 refills | Status: AC | PRN

## 2024-05-02 MED ORDER — FENTANYL CITRATE (PF) 50 MCG/ML IJ SOSY
25.0000 ug | PREFILLED_SYRINGE | INTRAMUSCULAR | Status: DC | PRN
  Administered 2024-05-02: 50 ug via INTRAVENOUS
  Filled 2024-05-02: qty 1

## 2024-05-02 MED ORDER — LIDOCAINE 2% (20 MG/ML) 5 ML SYRINGE
INTRAMUSCULAR | Status: DC | PRN
  Administered 2024-05-02: 60 mg via INTRAVENOUS

## 2024-05-02 MED ORDER — OXYCODONE HCL 5 MG/5ML PO SOLN: 5.0000 mg | Freq: Once | ORAL | Status: DC | PRN

## 2024-05-02 MED ORDER — HEMOSTATIC AGENTS (NO CHARGE) OPTIME
TOPICAL | Status: DC | PRN
  Administered 2024-05-02: 1 via TOPICAL

## 2024-05-02 MED ORDER — ONDANSETRON HCL 4 MG/2ML IJ SOLN
INTRAMUSCULAR | Status: AC
  Filled 2024-05-02: qty 2

## 2024-05-02 MED ORDER — ROCURONIUM BROMIDE 10 MG/ML (PF) SYRINGE
PREFILLED_SYRINGE | INTRAVENOUS | Status: DC | PRN
  Administered 2024-05-02: 10 mg via INTRAVENOUS
  Administered 2024-05-02: 70 mg via INTRAVENOUS
  Administered 2024-05-02 (×2): 30 mg via INTRAVENOUS

## 2024-05-02 MED ORDER — SODIUM CHLORIDE 0.9 % IR SOLN
Status: DC | PRN
  Administered 2024-05-02: 3000 mL

## 2024-05-02 MED ORDER — PROPOFOL 10 MG/ML IV BOLUS
INTRAVENOUS | Status: DC | PRN
  Administered 2024-05-02: 25 ug/kg/min via INTRAVENOUS
  Administered 2024-05-02: 200 mg via INTRAVENOUS
  Administered 2024-05-02: 25 ug/kg/min via INTRAVENOUS

## 2024-05-02 MED ORDER — ONDANSETRON HCL 4 MG/2ML IJ SOLN: 4.0000 mg | Freq: Once | INTRAMUSCULAR | Status: DC | PRN

## 2024-05-02 SURGICAL SUPPLY — 55 items
BLADE SURG SZ11 CARB STEEL (BLADE) ×1 IMPLANT
CAUTERY HOOK MNPLR 1.6 DVNC XI (INSTRUMENTS) ×1 IMPLANT
COVER LIGHT HANDLE STERIS (MISCELLANEOUS) ×2 IMPLANT
COVER MAYO STAND XLG (MISCELLANEOUS) ×1 IMPLANT
DERMABOND ADVANCED .7 DNX12 (GAUZE/BANDAGES/DRESSINGS) ×1 IMPLANT
DRAPE ARM DVNC X/XI (DISPOSABLE) ×4 IMPLANT
DRAPE COLUMN DVNC XI (DISPOSABLE) ×1 IMPLANT
DRIVER NDL MEGA SUTCUT DVNCXI (INSTRUMENTS) ×1 IMPLANT
DRIVER NDLE MEGA SUTCUT DVNCXI (INSTRUMENTS) ×1 IMPLANT
ELECTRODE REM PT RTRN 9FT ADLT (ELECTROSURGICAL) ×1 IMPLANT
FORCEPS PROGRASP DVNC XI (FORCEP) ×1 IMPLANT
GAUZE 4X4 16PLY ~~LOC~~+RFID DBL (SPONGE) ×2 IMPLANT
GLOVE BIOGEL PI IND STRL 7.0 (GLOVE) ×4 IMPLANT
GLOVE BIOGEL PI IND STRL 7.5 (GLOVE) IMPLANT
GLOVE BIOGEL PI IND STRL 8 (GLOVE) ×2 IMPLANT
GLOVE ECLIPSE 6.5 STRL STRAW (GLOVE) IMPLANT
GLOVE ECLIPSE 8.0 STRL XLNG CF (GLOVE) ×3 IMPLANT
GLOVE SURG SS PI 7.5 STRL IVOR (GLOVE) IMPLANT
GOWN STRL REUS W/TWL LRG LVL3 (GOWN DISPOSABLE) ×2 IMPLANT
GOWN STRL REUS W/TWL XL LVL3 (GOWN DISPOSABLE) ×2 IMPLANT
KIT PINK PAD W/HEAD ARM REST (MISCELLANEOUS) ×1 IMPLANT
KIT TURNOVER CYSTO (KITS) ×1 IMPLANT
MANIFOLD NEPTUNE II (INSTRUMENTS) ×1 IMPLANT
NDL HYPO 21X1.5 SAFETY (NEEDLE) ×1 IMPLANT
NDL INSUFFLATION 14GA 120MM (NEEDLE) ×1 IMPLANT
NEEDLE HYPO 21X1.5 SAFETY (NEEDLE) ×1 IMPLANT
NEEDLE INSUFFLATION 14GA 120MM (NEEDLE) ×1 IMPLANT
NS IRRIG 500ML POUR BTL (IV SOLUTION) ×1 IMPLANT
OBTURATOR OPTICALSTD 8 DVNC (TROCAR) ×1 IMPLANT
PACK PERI GYN (CUSTOM PROCEDURE TRAY) ×1 IMPLANT
POWDER SURGICEL 3.0 GRAM (HEMOSTASIS) IMPLANT
RUMI II GYRUS 4.0CM BLUE (DISPOSABLE) IMPLANT
SEAL UNIV 5-12 XI (MISCELLANEOUS) ×3 IMPLANT
SEALER VESSEL EXT DVNC XI (MISCELLANEOUS) ×1 IMPLANT
SET BASIN LINEN APH (SET/KITS/TRAYS/PACK) ×1 IMPLANT
SET TUBE DA VINCI INSUFFLATOR (TUBING) ×1 IMPLANT
SET TUBE IRRIG SUCTION NO TIP (IRRIGATION / IRRIGATOR) ×1 IMPLANT
SOLN 0.9% NACL 3000 ML (IV SOLUTION) IMPLANT
SOLUTION ANTFG W/FOAM PAD STRL (MISCELLANEOUS) ×1 IMPLANT
SPONGE T-LAP 18X18 ~~LOC~~+RFID (SPONGE) ×1 IMPLANT
SUT VIC AB 0 CTX36XBRD ANTBCTR (SUTURE) IMPLANT
SUT VICRYL 0 UR6 27IN ABS (SUTURE) ×1 IMPLANT
SUT VICRYL 3 0 (SUTURE) IMPLANT
SUT VICRYL AB 3-0 FS1 BRD 27IN (SUTURE) ×1 IMPLANT
SUTURE STRATFX 0 PDS+ CT-2 23 (SUTURE) ×1 IMPLANT
SYR 10ML LL (SYRINGE) ×2 IMPLANT
SYR 20ML LL LF (SYRINGE) ×1 IMPLANT
SYR 50ML LL SCALE MARK (SYRINGE) ×1 IMPLANT
SYR CONTROL 10ML LL (SYRINGE) ×2 IMPLANT
SYSTEM TROCR 1.5-3 SLV ABD GEL (ENDOMECHANICALS) IMPLANT
TIP ENDOSCOPIC SURGICEL (TIP) IMPLANT
TIP RUMI ORANGE 6.7MMX12CM (TIP) ×1 IMPLANT
TRAY FOLEY SLVR 16FR LF STAT (SET/KITS/TRAYS/PACK) ×1 IMPLANT
TROCAR KII 8X100ML NONTHREADED (TROCAR) ×1 IMPLANT
WATER STERILE IRR 500ML POUR (IV SOLUTION) ×1 IMPLANT

## 2024-05-02 NOTE — Anesthesia Preprocedure Evaluation (Signed)
 Anesthesia Evaluation  Patient identified by MRN, date of birth, ID band Patient awake    Reviewed: Allergy & Precautions, H&P , NPO status , Patient's Chart, lab work & pertinent test results, reviewed documented beta blocker date and time   Airway Mallampati: II  TM Distance: >3 FB Neck ROM: full    Dental no notable dental hx.    Pulmonary neg pulmonary ROS   Pulmonary exam normal breath sounds clear to auscultation       Cardiovascular Exercise Tolerance: Good hypertension, negative cardio ROS  Rhythm:regular Rate:Normal     Neuro/Psych negative neurological ROS  negative psych ROS   GI/Hepatic negative GI ROS, Neg liver ROS,,,  Endo/Other    Class 4 obesity  Renal/GU negative Renal ROS  negative genitourinary   Musculoskeletal   Abdominal   Peds  Hematology negative hematology ROS (+) Blood dyscrasia, anemia   Anesthesia Other Findings   Reproductive/Obstetrics negative OB ROS                              Anesthesia Physical Anesthesia Plan  ASA: 3  Anesthesia Plan: General and General ETT   Post-op Pain Management:    Induction:   PONV Risk Score and Plan: Ondansetron and Scopolamine patch - Pre-op  Airway Management Planned:   Additional Equipment:   Intra-op Plan:   Post-operative Plan:   Informed Consent: I have reviewed the patients History and Physical, chart, labs and discussed the procedure including the risks, benefits and alternatives for the proposed anesthesia with the patient or authorized representative who has indicated his/her understanding and acceptance.     Dental Advisory Given  Plan Discussed with: CRNA  Anesthesia Plan Comments:         Anesthesia Quick Evaluation

## 2024-05-02 NOTE — Transfer of Care (Signed)
 Immediate Anesthesia Transfer of Care Note  Patient: Joanna Hull  Procedure(s) Performed: HYSTERECTOMY, TOTAL, BILATERAL SAPLINGECTOMY, ROBOT ASSISTED, LAPAROSCOPIC, D5 (Abdomen)  Patient Location: PACU  Anesthesia Type:General  Level of Consciousness: awake  Airway & Oxygen Therapy: Patient Spontanous Breathing and Patient connected to nasal cannula oxygen  Post-op Assessment: Report given to RN and Post -op Vital signs reviewed and stable  Post vital signs: Reviewed and stable  Last Vitals:  Vitals Value Taken Time  BP 144/100   Temp 98   Pulse 79   Resp 16   SpO2 99%     Last Pain:  Vitals:   05/02/24 0640  PainSc: 0-No pain         Complications: No notable events documented.

## 2024-05-02 NOTE — H&P (Signed)
 Preoperative History and Physical  Joanna Hull is a 33 y.o. G0P0000 with No LMP recorded. admitted for a RA TLH + BS.   03/12/24 note: On exam today 18 weeks size CT scan 2023 shows 10 cm fibroid predominantly but there are other smaller fibroids seen Periods are miserable Has to leave work, soils, uses pads + tampons, super overnight, diapers Hemoglboin today 8.6  PMH:    Past Medical History:  Diagnosis Date   Anemia     PSH:     Past Surgical History:  Procedure Laterality Date   NO PAST SURGERIES      POb/GynH:      OB History     Gravida  0   Para  0   Term  0   Preterm  0   AB  0   Living  0      SAB  0   IAB  0   Ectopic  0   Multiple  0   Live Births  0           SH:   Social History   Tobacco Use   Smoking status: Never   Smokeless tobacco: Never  Vaping Use   Vaping status: Never Used  Substance Use Topics   Alcohol use: Never   Drug use: Never    FH:    Family History  Problem Relation Age of Onset   Colon cancer Father    Stroke Father    Stroke Mother    Diabetes Mother    Hypertension Mother      Allergies: No Known Allergies  Medications:       Current Facility-Administered Medications:    ceFAZolin (ANCEF) IVPB 2g/100 mL premix, 2 g, Intravenous, On Call to OR, Jayne Vonn DEL, MD   chlorhexidine (PERIDEX) 0.12 % solution, , , ,    povidone-iodine 10 % swab 2 Application, 2 Application, Topical, Once, Jayne Vonn DEL, MD  Review of Systems:   Review of Systems  Constitutional: Negative for fever, chills, weight loss, malaise/fatigue and diaphoresis.  HENT: Negative for hearing loss, ear pain, nosebleeds, congestion, sore throat, neck pain, tinnitus and ear discharge.   Eyes: Negative for blurred vision, double vision, photophobia, pain, discharge and redness.  Respiratory: Negative for cough, hemoptysis, sputum production, shortness of breath, wheezing and stridor.   Cardiovascular: Negative for chest pain,  palpitations, orthopnea, claudication, leg swelling and PND.  Gastrointestinal: Positive for abdominal pain. Negative for heartburn, nausea, vomiting, diarrhea, constipation, blood in stool and melena.  Genitourinary: Negative for dysuria, urgency, frequency, hematuria and flank pain.  Musculoskeletal: Negative for myalgias, back pain, joint pain and falls.  Skin: Negative for itching and rash.  Neurological: Negative for dizziness, tingling, tremors, sensory change, speech change, focal weakness, seizures, loss of consciousness, weakness and headaches.  Endo/Heme/Allergies: Negative for environmental allergies and polydipsia. Does not bruise/bleed easily.  Psychiatric/Behavioral: Negative for depression, suicidal ideas, hallucinations, memory loss and substance abuse. The patient is not nervous/anxious and does not have insomnia.      PHYSICAL EXAM:  Blood pressure (!) 157/107, pulse 69, temperature 98.3 F (36.8 C), height 5' 1 (1.549 m), weight 95 kg, SpO2 100%.    Vitals reviewed. Constitutional: She is oriented to person, place, and time. She appears well-developed and well-nourished.  HENT:  Head: Normocephalic and atraumatic.  Right Ear: External ear normal.  Left Ear: External ear normal.  Nose: Nose normal.  Mouth/Throat: Oropharynx is clear and moist.  Eyes: Conjunctivae and EOM  are normal. Pupils are equal, round, and reactive to light. Right eye exhibits no discharge. Left eye exhibits no discharge. No scleral icterus.  Neck: Normal range of motion. Neck supple. No tracheal deviation present. No thyromegaly present.  Cardiovascular: Normal rate, regular rhythm, normal heart sounds and intact distal pulses.  Exam reveals no gallop and no friction rub.   No murmur heard. Respiratory: Effort normal and breath sounds normal. No respiratory distress. She has no wheezes. She has no rales. She exhibits no tenderness.  GI: Soft. Bowel sounds are normal. She exhibits no distension  and no mass. There is tenderness. There is no rebound and no guarding.  Genitourinary:       Vulva is normal without lesions Vagina is pink moist without discharge Cervix normal in appearance and pap is normal Uterus is 18 weeks size on exam, fibroids seen on CT scan Adnexa is negative with normal sized ovaries by sonogram  Musculoskeletal: Normal range of motion. She exhibits no edema and no tenderness.  Neurological: She is alert and oriented to person, place, and time. She has normal reflexes. She displays normal reflexes. No cranial nerve deficit. She exhibits normal muscle tone. Coordination normal.  Skin: Skin is warm and dry. No rash noted. No erythema. No pallor.  Psychiatric: She has a normal mood and affect. Her behavior is normal. Judgment and thought content normal.    Labs: Results for orders placed or performed during the hospital encounter of 04/30/24 (from the past 2 weeks)  CBC with Differential/Platelet   Collection Time: 04/30/24  9:54 AM  Result Value Ref Range   WBC 4.0 4.0 - 10.5 K/uL   RBC 4.44 3.87 - 5.11 MIL/uL   Hemoglobin 10.7 (L) 12.0 - 15.0 g/dL   HCT 64.2 (L) 63.9 - 53.9 %   MCV 80.4 80.0 - 100.0 fL   MCH 24.1 (L) 26.0 - 34.0 pg   MCHC 30.0 30.0 - 36.0 g/dL   RDW Not Measured 88.4 - 15.5 %   Platelets 310 150 - 400 K/uL   nRBC 0.0 0.0 - 0.2 %   Neutrophils Relative % 54 %   Neutro Abs 2.1 1.7 - 7.7 K/uL   Lymphocytes Relative 36 %   Lymphs Abs 1.4 0.7 - 4.0 K/uL   Monocytes Relative 6 %   Monocytes Absolute 0.3 0.1 - 1.0 K/uL   Eosinophils Relative 3 %   Eosinophils Absolute 0.1 0.0 - 0.5 K/uL   Basophils Relative 1 %   Basophils Absolute 0.0 0.0 - 0.1 K/uL   Smear Review Normal platelet morphology    Immature Granulocytes 0 %   Abs Immature Granulocytes 0.01 0.00 - 0.07 K/uL   Reactive, Benign Lymphocytes PRESENT    Dimorphism PRESENT    Ovalocytes PRESENT   Comprehensive metabolic panel   Collection Time: 04/30/24  9:54 AM  Result Value  Ref Range   Sodium 139 135 - 145 mmol/L   Potassium 3.7 3.5 - 5.1 mmol/L   Chloride 104 98 - 111 mmol/L   CO2 25 22 - 32 mmol/L   Glucose, Bld 81 70 - 99 mg/dL   BUN 10 6 - 20 mg/dL   Creatinine, Ser 9.35 0.44 - 1.00 mg/dL   Calcium 9.1 8.9 - 89.6 mg/dL   Total Protein 7.6 6.5 - 8.1 g/dL   Albumin 4.2 3.5 - 5.0 g/dL   AST 36 15 - 41 U/L   ALT 35 0 - 44 U/L   Alkaline Phosphatase 59 38 - 126  U/L   Total Bilirubin 0.3 0.0 - 1.2 mg/dL   GFR, Estimated >39 >39 mL/min   Anion gap 10 5 - 15  Rapid HIV screen (HIV 1/2 Ab+Ag)   Collection Time: 04/30/24  9:54 AM  Result Value Ref Range   HIV-1 P24 Antigen - HIV24 NON REACTIVE NON REACTIVE   HIV 1/2 Antibodies NON REACTIVE NON REACTIVE   Interpretation (HIV Ag Ab)      A non reactive test result means that HIV 1 or HIV 2 antibodies and HIV 1 p24 antigen were not detected in the specimen.  Type and screen [facility]   Collection Time: 04/30/24  9:54 AM  Result Value Ref Range   ABO/RH(D) O POS    Antibody Screen NEG    Sample Expiration      05/14/2024,2359 Performed at [facility], [address] [place], [place] [address]   Urinalysis, Routine w reflex microscopic -Urine, Clean Catch   Collection Time: 04/30/24  9:57 AM  Result Value Ref Range   Color, Urine YELLOW YELLOW   APPearance HAZY (A) CLEAR   Specific Gravity, Urine 1.021 1.005 - 1.030   pH 5.0 5.0 - 8.0   Glucose, UA NEGATIVE NEGATIVE mg/dL   Hgb urine dipstick NEGATIVE NEGATIVE   Bilirubin Urine NEGATIVE NEGATIVE   Ketones, ur NEGATIVE NEGATIVE mg/dL   Protein, ur NEGATIVE NEGATIVE mg/dL   Nitrite NEGATIVE NEGATIVE   Leukocytes,Ua NEGATIVE NEGATIVE  Pregnancy, urine POC   Collection Time: 04/30/24 10:08 AM  Result Value Ref Range   Preg Test, Ur NEGATIVE NEGATIVE  ABO/Rh   Collection Time: 05/02/24  6:43 AM  Result Value Ref Range   ABO/RH(D)      O POS Performed at [facility], [address] [place], [place] [address]     EKG: No orders  found for this or any previous visit.  Imaging Studies: No results found.    Assessment: Fibroids, 18 weeks size Menorrhagia Dysmenorrhea IDA due to menorrhagia  Plan: RA TLH + BS, preserve ovaries  Vonn VEAR Inch 05/02/2024 7:27 AM

## 2024-05-02 NOTE — Anesthesia Procedure Notes (Addendum)
 Procedure Name: Intubation Date/Time: 05/02/2024 7:43 AM  Performed by: Elaine Delon CROME, CRNAPre-anesthesia Checklist: Patient identified, Emergency Drugs available, Suction available and Patient being monitored Patient Re-evaluated:Patient Re-evaluated prior to induction Oxygen Delivery Method: Circle system utilized Preoxygenation: Pre-oxygenation with 100% oxygen Induction Type: IV induction Ventilation: Mask ventilation without difficulty Laryngoscope Size: Mac and 3 Grade View: Grade I Tube type: Oral Tube size: 7.0 mm Number of attempts: 1 Airway Equipment and Method: Stylet Placement Confirmation: ETT inserted through vocal cords under direct vision, positive ETCO2 and breath sounds checked- equal and bilateral Secured at: 21 cm Tube secured with: Tape Dental Injury: Teeth and Oropharynx as per pre-operative assessment

## 2024-05-02 NOTE — Op Note (Signed)
 Preoperative diagnosis: Fibroids, 18 weeks size Menorrhagia Dysmenorrhea IDA due to menorrhagia   Postoperative diagnosis: SAA   Procedure: dV 5 Robotic hysterectomy with removal of Fallopian tubes, opportunistic  Laparoscopic guided transversus abdominus plane block for post op pain management  Surgeon: Vonn VEAR Inch, MD   Anesthesia: General endotracheal   Findings: Enlarged fibroid uterus   Description of operation: Patient was taken to the operating room and placed in the low lithotomy position She was prepped and draped in the usual sterile fashion robotic assisted laparoscopic procedure A Foley catheter was placed The vagina was once again prepped additionally   A RUMI II 12 cm with 4 cervical cup was placed for uterine manipulation and colpotomy delineation   An incision was made above the umbilicus The umbilical fascia was grasped A varies needle was used and placed into the peritoneum with 1 pass A pneumoperitoneum was created to a pressure of 15   An 8 mm port was placed into the peritoneal cavity using a nonbladed trocar easily with 1 pass using the video laparoscope The peritoneal cavity was confirmed   There were no unusual findings 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   4 additional 8 mm ports were placed at approximately the same level as the supraumbilical port 3 of the ports were robotic and 1 is an assist port   One was left lateral and 1 was placed right lateral, both lateral to the rectus anterior muscle These were placed under direct visualization without difficulty into the peritoneal cavity Nonbladed trocars were used in all instances    The patient was placed in 24 degrees of Trendelenburg   The [employer] robot was then docked to the 3 robotic ports   Instruments used during the robotic hysterectomy: Vessel sealer extender using bipolar energy ProGrasp forceps with no energy Monopolar  hook Megacut needle driver 2-0 PDS symmetrical STRATAFIX on a CT 2 needle Gelport   I then left the patient and went to the surgical console   The RUMI II  was used throughout the case to apply traction and anterior/posterior displacement of the uterus to facilitate the robotic procedure The ProGrasp was used to put traction on the left adnexa The left ureter was identified and found to be well away from the adnexal vessels The vessel sealer extender using bipolar energy was used and the utero-ovarian ligament was coagulated and then transected I used traction medially and anteriorly and used the vessel sealer to take the broad ligament and round ligament down to the level of the cervical isthmus just lateral to the left uterine vessels   I then turned my attention to the right adnexa The pro grasp was used and medial and upward traction was placed The vessel sealer extender using bipolar energy was used to coagulate and ligate the right infundibulopelvic ligament vessels Again the right ureter was well inferior to the vessels I continued use medial and anterior retraction using the ProGrasp and the vessel sealer with the bipolar energy was used to take the broad ligament and round ligament down to the level of the cervical isthmus and lateral to the uterine vessels   I then placed the monopolar hook in the place of the ProGrasp The vessel sealer extender was used to grasp the peritoneum of the bladder provide traction, of course no energy was used for this I used the monopolar hook with energy to open of the peritoneal leaf anteriorly and dissect the  bladder peritoneum off the lower uterine segment and cervix beyond the level of the vaginal colpotomy cup I then used the monopolar hook to take the peritoneum down where I stopped using the vessel sealer and met in the bladder dissection bilaterally   The vessel sealer extender with monopolar energy was used to coagulate the uterine vessels at  the level of the cervical isthmus bilaterally and then just above and just below to manage backbleeding The uterine vessels were transected There was good hemostasis I then stayed medial to this uterine vessel pedicle with the vessel sealer extender and took down the paracervical tissue to allow colpotomy incision using the monopolar hook, preserving the cardinal ligament Again there was good hemostasis bilaterally   I then used the monopolar hook and made a posterior colpotomy incision above the level of insertion of the uterosacral ligaments I used a frowny face then smiley face technique and opened the vagina to approximately 8:00 and 4:00 posteriorly I then used the vessel sealer extender with bipolar energy, coagulating and then transecting the vagina   The monopolar hook was used to make the anterior colpotomy incision as the bladder had been dissected well past the colpotomy ring Cephalad tension was placed on the Vcare handle throughout this portion of the procedure to prevent thermal injury to the bladder and safe distance from the ureters bilaterally Colpotomy incision was made from 10:00 to 2:00 The vessel sealer with bipolar energy was then used to coagulate and transect the vagina, again inside of the cardinal ligament   The uterus was removed through the umbilica incision   The tubes were removed thru ports A wet lap tape was placed in the vagina to maintain pneumoperitoneum   All pedicles were found to be hemostatic there was very little blood loss I then removed the vessel sealer and monopolar hook The pro grasp was replaced and the needle driver was also placed along with the suture   The vagina was closed using the 2-0 PDS STRATAFIX symmetrical suture on the CT 2 needle I pay close attention to the vaginal corners bilaterally and incorporated those in the closure 1.5 cm of vagina was operating to the vaginal closure I also fixed the uterosacral ligaments to the vaginal  cuff repair shortening the uterosacral ligaments thus providing improved vaginal vault suspension The cardinal ligaments were intact again improving postoperative vaginal support Pubocervical rectovaginal and posterior peritoneum were all included in the vaginal closure There was good result hemostasis   At the end of the procedure all the pedicles were identified and found to be hemostatic Both ureters were identified and undergoing normal peristalsis, they were well out of the way of surgical field throughout   I then got up from the console and went back to the side of the patient after gowning and gloving sterilely  I placed a  transversus abdominus plane block was placed using laparoscopic guidance at T10 and T7 bilaterally, 10 cc of 0.25% bupivacaine  at each site, 40 cc total of 100 mg of bupivacaine .  Doyle's bubble technique was used. This was placed for post operative pain management.  ExCITE technique was used to remove the specimen through the extended supraumbilical incision.  Total time of 11 minutes was required for complete tissue extraction.   The 5 robotic ports were removed The insufflation ports were used to actively desufflate the peritoneal cavity to a pressure of 0 Ports were then removed   The subcutaneous tissue of all 5 incisions was closed using  0 Vicryl All 5 skin incisions were closed using 3-0 vicryl in a subcuticular manner Dermabond was used all 5 incision sites  Each incision was dressed   The patient tolerated the procedure well She received 2 g of Ancef  and 30 mg of Toradol  preoperatively   EBL: 250 cc   Vonn VEAR Inch, MD 05/02/2024 11:54 AM

## 2024-05-02 NOTE — Progress Notes (Signed)
 Peri pad changed minimal amount of bloody drainage.

## 2024-05-02 NOTE — Anesthesia Postprocedure Evaluation (Signed)
 Anesthesia Post Note  Patient: Joanna Hull  Procedure(s) Performed: HYSTERECTOMY, TOTAL, BILATERAL SAPLINGECTOMY, ROBOT ASSISTED, LAPAROSCOPIC, D5 (Abdomen)  Patient location during evaluation: Phase II Anesthesia Type: General Level of consciousness: awake Pain management: pain level controlled Vital Signs Assessment: post-procedure vital signs reviewed and stable Respiratory status: spontaneous breathing and respiratory function stable Cardiovascular status: blood pressure returned to baseline and stable Postop Assessment: no headache and no apparent nausea or vomiting Anesthetic complications: no Comments: Late entry   No notable events documented.   Last Vitals:  Vitals:   05/02/24 1300 05/02/24 1315  BP: (!) 133/94 136/87  Pulse: 87 78  Resp: (!) 22 18  Temp:  36.9 C  SpO2: 100% 97%    Last Pain:  Vitals:   05/02/24 1354  PainSc: 6                  Yvonna JINNY Bosworth

## 2024-05-03 ENCOUNTER — Encounter (HOSPITAL_COMMUNITY): Payer: Self-pay | Admitting: Obstetrics & Gynecology

## 2024-05-04 LAB — SURGICAL PATHOLOGY

## 2024-05-08 ENCOUNTER — Encounter (HOSPITAL_COMMUNITY): Payer: Self-pay | Admitting: Obstetrics and Gynecology

## 2024-05-08 ENCOUNTER — Other Ambulatory Visit: Payer: Self-pay

## 2024-05-08 ENCOUNTER — Inpatient Hospital Stay (HOSPITAL_COMMUNITY)

## 2024-05-08 ENCOUNTER — Inpatient Hospital Stay (HOSPITAL_COMMUNITY)
Admission: AD | Admit: 2024-05-08 | Discharge: 2024-05-09 | Disposition: A | Discharge status: Home / Self Care | Attending: Obstetrics and Gynecology | Admitting: Obstetrics and Gynecology

## 2024-05-08 ENCOUNTER — Telehealth: Payer: Self-pay

## 2024-05-08 DIAGNOSIS — R079 Chest pain, unspecified: Secondary | ICD-10-CM | POA: Diagnosis not present

## 2024-05-08 DIAGNOSIS — R0602 Shortness of breath: Secondary | ICD-10-CM | POA: Insufficient documentation

## 2024-05-08 DIAGNOSIS — Z9889 Other specified postprocedural states: Secondary | ICD-10-CM | POA: Insufficient documentation

## 2024-05-08 DIAGNOSIS — R109 Unspecified abdominal pain: Secondary | ICD-10-CM | POA: Diagnosis not present

## 2024-05-08 DIAGNOSIS — R600 Localized edema: Secondary | ICD-10-CM | POA: Diagnosis not present

## 2024-05-08 DIAGNOSIS — R911 Solitary pulmonary nodule: Secondary | ICD-10-CM | POA: Diagnosis not present

## 2024-05-08 DIAGNOSIS — R0789 Other chest pain: Secondary | ICD-10-CM

## 2024-05-08 LAB — CBC
HCT: 35.2 % — ABNORMAL LOW (ref 36.0–46.0)
Hemoglobin: 10.4 g/dL — ABNORMAL LOW (ref 12.0–15.0)
MCH: 23.9 pg — ABNORMAL LOW (ref 26.0–34.0)
MCHC: 29.5 g/dL — ABNORMAL LOW (ref 30.0–36.0)
MCV: 80.9 fL (ref 80.0–100.0)
Platelets: 390 K/uL (ref 150–400)
RBC: 4.35 MIL/uL (ref 3.87–5.11)
WBC: 5.8 K/uL (ref 4.0–10.5)
nRBC: 0 % (ref 0.0–0.2)

## 2024-05-08 LAB — BASIC METABOLIC PANEL WITH GFR
Anion gap: 9 (ref 5–15)
BUN: 8 mg/dL (ref 6–20)
CO2: 25 mmol/L (ref 22–32)
Calcium: 9.1 mg/dL (ref 8.9–10.3)
Chloride: 102 mmol/L (ref 98–111)
Creatinine, Ser: 0.81 mg/dL (ref 0.44–1.00)
GFR, Estimated: >60 mL/min (ref >60)
Glucose, Bld: 94 mg/dL (ref 70–99)
Potassium: 3.5 mmol/L (ref 3.5–5.1)
Sodium: 136 mmol/L (ref 135–145)

## 2024-05-08 LAB — TROPONIN I (HIGH SENSITIVITY)
Troponin I (High Sensitivity): <2 ng/L (ref <18)
Troponin I (High Sensitivity): 7 ng/L (ref <18)

## 2024-05-08 LAB — CBG MONITORING, ED: Glucose-Capillary: 67 mg/dL — ABNORMAL LOW (ref 70–99)

## 2024-05-08 LAB — HCG, SERUM, QUALITATIVE: Preg, Serum: NEGATIVE

## 2024-05-08 LAB — D-DIMER, QUANTITATIVE: D-Dimer, Quant: 0.82 ug{FEU}/mL — ABNORMAL HIGH (ref 0.00–0.50)

## 2024-05-08 MED ORDER — IOHEXOL 350 MG/ML SOLN
75.0000 mL | Freq: Once | INTRAVENOUS | Status: AC | PRN
  Administered 2024-05-08: 75 mL via INTRAVENOUS

## 2024-05-08 NOTE — MAU Note (Signed)
 Joanna Hull is a 33 y.o. at Unknown here in MAU reporting: heavy chest tightness, SOB post surgery 11/12. L Cooleen NP in triage with pt.  Pt does have to stop when talking to breath.  LMP: none Onset of complaint: last night Pain score: 4/10 Vitals:   05/08/24 1501  BP: (!) 154/91  Pulse: 71  Resp: 18  Temp: 98.4 F (36.9 C)  SpO2: 100%     FHT: na  Lab orders placed from triage: none TO notify ED triage and NP to notify provider.

## 2024-05-08 NOTE — ED Triage Notes (Signed)
 Pt endorses heaviness in chest when trying to take deep breath. Radiates to shoulder.  6 days post hysterectomy.

## 2024-05-08 NOTE — Telephone Encounter (Signed)
 Patient called office with chest discomfort and difficulty breathing. Patient had hysterectomy on 05/02/24. RN wanted to clarify that the difficulty breathing was not related to pain from incision sites. Patient states incisions have not been giving her any problems. Due to the nature of the complaints and the proximity to surgery RN advised to be further evaluated at either an ED or MAU in [place]. Patient amenable to plan. All questions answered.

## 2024-05-08 NOTE — ED Notes (Signed)
 Notified RN of low blood sugar. KIT

## 2024-05-08 NOTE — ED Triage Notes (Signed)
 Pt coming in from home after she had a total hysterectomy on Wednesday. Pt is short of breath in triage. Pt reports pain in her upper left chest.

## 2024-05-08 NOTE — ED Provider Triage Note (Signed)
 Emergency Medicine Provider Triage Evaluation Note  Arabia Nylund , a 33 y.o. female  was evaluated in triage.  Pt complains of CP, SOB s/p hysterectomy.  Review of Systems  Positive: fever Negative: Calf pain, swelling  Physical Exam  BP (!) 153/108 (BP Location: Right Arm)   Pulse 72   Temp 98.5 F (36.9 C) (Oral)   Resp 16   Ht 5' 1 (1.549 m)   Wt 95 kg   LMP 03/05/2024 (Exact Date)   SpO2 100%   BMI 39.57 kg/m  Gen:   Awake, no distress   Resp:  Normal effort  MSK:   Moves extremities without difficulty  Other:    Medical Decision Making  Medically screening exam initiated at 3:34 PM.  Appropriate orders placed.  Trish Mancinelli was informed that the remainder of the evaluation will be completed by another provider, this initial triage assessment does not replace that evaluation, and the importance of remaining in the ED until their evaluation is complete.     Shermon Warren SAILOR, PA-C 05/08/24 1534

## 2024-05-08 NOTE — MAU Provider Note (Signed)
   S/HPI [name] is a 33 y.o. [id number] patient who presents to MAU today with complaint of she is status post hysterectomy on 05/02/2024 and called the office today with complaints of shortness of breath difficulty breathing when she takes a deep breath as well as left-sided chest pain/heaviness.  She denies any shoulder pain and reports the pain is not related to her incisional sites.  Patient states she called the office today and was instructed to go to the ED for further evaluation.     O BP (!) 154/91 (BP Location: Right Arm)   Pulse 71   Temp 98.4 F (36.9 C) (Oral)   Resp 18   SpO2 100%  Physical Exam Constitutional:      General: She is not in acute distress.    Appearance: She is well-developed. She is obese. She is not ill-appearing.  HENT:     Head: Normocephalic.  Cardiovascular:     Rate and Rhythm: Normal rate.     Comments: Hypertensive Pulmonary:     Effort: Pulmonary effort is normal.  Musculoskeletal:        General: Normal range of motion.     Cervical back: Normal range of motion.  Skin:    General: Skin is warm.  Neurological:     Mental Status: She is alert and oriented to person, place, and time.  Psychiatric:        Mood and Affect: Mood normal.        Behavior: Behavior normal.     MDM  LOW-> Transfer to ED for further evaluation due to S/P Hysterectomy with complaints of SOB and chest pain. I discussed this transfer with Dr Rolan Quale (ED Physician) who has accepted the transfer. Patient is stable for transfer per my initial MSE    ASSESSMENT Medical screening exam complete   PLAN Transfer to ED for further evaluation and management ( Dr Quale is the accepting physician in the ED)    Littie Olam LABOR, NP 05/08/2024 3:11 PM   This chart was dictated using voice recognition software, Dragon. Despite the best efforts of this provider to proofread and correct errors, errors may still occur which can change documentation meaning.

## 2024-05-09 MED ORDER — ACETAMINOPHEN 325 MG PO TABS
650.0000 mg | ORAL_TABLET | Freq: Once | ORAL | Status: AC
  Administered 2024-05-09: 650 mg via ORAL
  Filled 2024-05-09: qty 2

## 2024-05-09 NOTE — Discharge Instructions (Addendum)

## 2024-05-09 NOTE — ED Provider Notes (Signed)
 [facility] Provider Note   CSN: [id number] Arrival date & time: 05/08/24  1453     Patient presents with: post surgical, Shortness of Breath, and Chest Pain   Joanna Hull is a 33 y.o. female with PMHx anemia, s/p hysterectomy 05/02/2024 who presents to ED concerned for left chest discomfort that increases with deep inspirations. Symptoms have been constant x2 days and not associated with rest vs exertion vs food intake. Patient denies recent fever, rhinorrhea, congestion, sore throat, cough, nausea, vomiting, diarrhea. Patient stating that she was told to expect some discomfort in abdomen and chest d/t the air left over from her surgery; however, when patient told surgeon about the chest pain today - they referred patient to ED.     Shortness of Breath Associated symptoms: chest pain   Chest Pain Associated symptoms: shortness of breath        Prior to Admission medications   Medication Sig Start Date End Date Taking? Authorizing Provider  cholecalciferol (VITAMIN D3) 25 MCG (1000 UNIT) tablet Take 1,000 Units by mouth once a week.    [provider]  ciprofloxacin (CIPRO) 500 MG tablet Take 1 tablet (500 mg total) by mouth 2 (two) times daily. 05/02/24   Jayne Vonn DEL, MD  ketorolac (TORADOL) 10 MG tablet Take 1 tablet (10 mg total) by mouth every 8 (eight) hours as needed. 05/02/24   Jayne Vonn DEL, MD  ondansetron (ZOFRAN-ODT) 8 MG disintegrating tablet Take 1 tablet (8 mg total) by mouth every 8 (eight) hours as needed for nausea or vomiting. 05/02/24   Jayne Vonn DEL, MD  oxyCODONE-acetaminophen (PERCOCET) 7.5-325 MG tablet Take 1 tablet by mouth every 6 (six) hours as needed. 05/02/24   Jayne Vonn DEL, MD    Allergies: Patient has no known allergies.    Review of Systems  Respiratory:  Positive for shortness of breath.   Cardiovascular:  Positive for chest pain.    Updated Vital Signs BP (!) 144/89 (BP Location:  Right Arm)   Pulse 77   Temp 98.3 F (36.8 C)   Resp 16   Ht 5' 1 (1.549 m)   Wt 95 kg   LMP 03/05/2024 (Exact Date)   SpO2 100%   BMI 39.57 kg/m   Physical Exam Vitals and nursing note reviewed.  Constitutional:      General: She is not in acute distress.    Appearance: She is not ill-appearing or toxic-appearing.  HENT:     Head: Normocephalic and atraumatic.     Mouth/Throat:     Mouth: Mucous membranes are moist.     Pharynx: No posterior oropharyngeal erythema.  Eyes:     General: No scleral icterus.       Right eye: No discharge.        Left eye: No discharge.     Conjunctiva/sclera: Conjunctivae normal.  Cardiovascular:     Rate and Rhythm: Normal rate and regular rhythm.     Pulses: Normal pulses.     Heart sounds: Normal heart sounds. No murmur heard. Pulmonary:     Effort: Pulmonary effort is normal. No respiratory distress.     Breath sounds: Normal breath sounds. No wheezing, rhonchi or rales.  Abdominal:     General: Bowel sounds are normal.     Palpations: Abdomen is soft.  Musculoskeletal:     Right lower leg: No edema.     Left lower leg: No edema.  Skin:    General:  Skin is warm and dry.     Findings: No rash.  Neurological:     General: No focal deficit present.     Mental Status: She is alert and oriented to person, place, and time. Mental status is at baseline.  Psychiatric:        Mood and Affect: Mood normal.        Behavior: Behavior normal.     (all labs ordered are listed, but only abnormal results are displayed) Labs Reviewed  CBC - Abnormal; Notable for the following components:      Result Value   Hemoglobin 10.4 (*)    HCT 35.2 (*)    MCH 23.9 (*)    MCHC 29.5 (*)    All other components within normal limits  D-DIMER, QUANTITATIVE - Abnormal; Notable for the following components:   D-Dimer, Quant 0.82 (*)    All other components within normal limits  CBG MONITORING, ED - Abnormal; Notable for the following components:    Glucose-Capillary 67 (*)    All other components within normal limits  BASIC METABOLIC PANEL WITH GFR  HCG, SERUM, QUALITATIVE  TROPONIN I (HIGH SENSITIVITY)  TROPONIN I (HIGH SENSITIVITY)    EKG: EKG Interpretation Date/Time:  Tuesday May 08 2024 15:28:21 EST Ventricular Rate:  76 PR Interval:  172 QRS Duration:  78 QT Interval:  374 QTC Calculation: 420 R Axis:   60  Text Interpretation: Normal sinus rhythm Normal ECG No previous ECGs available Confirmed by Raford Lenis ([id number]) on 05/08/2024 11:05:33 PM  Radiology: CT Angio Chest PE W and/or Wo Contrast Result Date: 05/08/2024 CLINICAL DATA:  Abdominal pain and chest heaviness. Positive D-dimer. Six days status post hysterectomy. EXAM: CT ANGIOGRAPHY CHEST CT ABDOMEN AND PELVIS WITH CONTRAST TECHNIQUE: Multidetector CT imaging of the chest was performed using the standard protocol during bolus administration of intravenous contrast. Multiplanar CT image reconstructions and MIPs were obtained to evaluate the vascular anatomy. Multidetector CT imaging of the abdomen and pelvis was performed using the standard protocol during bolus administration of intravenous contrast. RADIATION DOSE REDUCTION: This exam was performed according to the departmental dose-optimization program which includes automated exposure control, adjustment of the mA and/or kV according to patient size and/or use of iterative reconstruction technique. CONTRAST:  75mL OMNIPAQUE  IOHEXOL  350 MG/ML SOLN COMPARISON:  CT abdomen and pelvis 04/26/2022 FINDINGS: CTA CHEST FINDINGS Cardiovascular: Satisfactory opacification of the pulmonary arteries to the segmental level. No evidence of pulmonary embolism. Normal heart size. No pericardial effusion. Abberant right subclavian artery present. Mediastinum/Nodes: No enlarged mediastinal, hilar, or axillary lymph nodes. Thyroid gland, trachea, and esophagus demonstrate no significant findings. Lungs/Pleura: There is a 9 mm left  lower lobe nodular density at the lung base. lungs are otherwise clear. No pleural effusion or pneumothorax. Musculoskeletal: No chest wall abnormality. No acute or significant osseous findings. Review of the MIP images confirms the above findings. CT ABDOMEN and PELVIS FINDINGS Hepatobiliary: No focal liver abnormality is seen. No gallstones, gallbladder wall thickening, or biliary dilatation. Pancreas: Unremarkable. No pancreatic ductal dilatation or surrounding inflammatory changes. Spleen: Normal in size without focal abnormality. Adrenals/Urinary Tract: There is a subcentimeter left renal cyst. The kidneys are otherwise normal. Bladder and adrenal glands appear normal. Stomach/Bowel: Stomach is within normal limits. Appendix appears normal. No evidence of bowel wall thickening, distention, or inflammatory changes. Vascular/Lymphatic: No significant vascular findings are present. No enlarged abdominal or pelvic lymph nodes. Reproductive: The uterus is surgically absent. There is a samll fluid collection along the  vaginal cuff measuring 4.9 x 0.9 x 1.3 cm. The adnexa appear within normal limits. Other: No abdominal wall hernia. There is mild subcutaneous edema in the anterior abdominal wall likely secondary to surgery. No abdominopelvic ascites.No fluid collection. Musculoskeletal: No fracture is seen. Review of the MIP images confirms the above findings. IMPRESSION: 1. No evidence for pulmonary embolism. 2. Status post hysterectomy. There is a small fluid collection along the vaginal cuff which may represent a postoperative seroma or hematoma. 3. 9 mm left lower lobe pulmonary nodule. Consider one of the following in 3 months for both low-risk and high-risk individuals: (a) repeat chest CT, (b) follow-up PET-CT, or (c) tissue sampling. This recommendation follows the consensus statement: Guidelines for Management of Incidental Pulmonary Nodules Detected on CT Images: From the Fleischner Society 2017; Radiology  2017; 284:228-243. 4. Mild subcutaneous edema in the anterior abdominal wall likely secondary to surgery. Electronically Signed   By: Greig Pique M.D.   On: 05/08/2024 18:43   CT ABDOMEN PELVIS W CONTRAST Result Date: 05/08/2024 CLINICAL DATA:  Abdominal pain and chest heaviness. Positive D-dimer. Six days status post hysterectomy. EXAM: CT ANGIOGRAPHY CHEST CT ABDOMEN AND PELVIS WITH CONTRAST TECHNIQUE: Multidetector CT imaging of the chest was performed using the standard protocol during bolus administration of intravenous contrast. Multiplanar CT image reconstructions and MIPs were obtained to evaluate the vascular anatomy. Multidetector CT imaging of the abdomen and pelvis was performed using the standard protocol during bolus administration of intravenous contrast. RADIATION DOSE REDUCTION: This exam was performed according to the departmental dose-optimization program which includes automated exposure control, adjustment of the mA and/or kV according to patient size and/or use of iterative reconstruction technique. CONTRAST:  75mL OMNIPAQUE  IOHEXOL  350 MG/ML SOLN COMPARISON:  CT abdomen and pelvis 04/26/2022 FINDINGS: CTA CHEST FINDINGS Cardiovascular: Satisfactory opacification of the pulmonary arteries to the segmental level. No evidence of pulmonary embolism. Normal heart size. No pericardial effusion. Abberant right subclavian artery present. Mediastinum/Nodes: No enlarged mediastinal, hilar, or axillary lymph nodes. Thyroid gland, trachea, and esophagus demonstrate no significant findings. Lungs/Pleura: There is a 9 mm left lower lobe nodular density at the lung base. lungs are otherwise clear. No pleural effusion or pneumothorax. Musculoskeletal: No chest wall abnormality. No acute or significant osseous findings. Review of the MIP images confirms the above findings. CT ABDOMEN and PELVIS FINDINGS Hepatobiliary: No focal liver abnormality is seen. No gallstones, gallbladder wall thickening, or  biliary dilatation. Pancreas: Unremarkable. No pancreatic ductal dilatation or surrounding inflammatory changes. Spleen: Normal in size without focal abnormality. Adrenals/Urinary Tract: There is a subcentimeter left renal cyst. The kidneys are otherwise normal. Bladder and adrenal glands appear normal. Stomach/Bowel: Stomach is within normal limits. Appendix appears normal. No evidence of bowel wall thickening, distention, or inflammatory changes. Vascular/Lymphatic: No significant vascular findings are present. No enlarged abdominal or pelvic lymph nodes. Reproductive: The uterus is surgically absent. There is a samll fluid collection along the vaginal cuff measuring 4.9 x 0.9 x 1.3 cm. The adnexa appear within normal limits. Other: No abdominal wall hernia. There is mild subcutaneous edema in the anterior abdominal wall likely secondary to surgery. No abdominopelvic ascites.No fluid collection. Musculoskeletal: No fracture is seen. Review of the MIP images confirms the above findings. IMPRESSION: 1. No evidence for pulmonary embolism. 2. Status post hysterectomy. There is a small fluid collection along the vaginal cuff which may represent a postoperative seroma or hematoma. 3. 9 mm left lower lobe pulmonary nodule. Consider one of the following in 3 months  for both low-risk and high-risk individuals: (a) repeat chest CT, (b) follow-up PET-CT, or (c) tissue sampling. This recommendation follows the consensus statement: Guidelines for Management of Incidental Pulmonary Nodules Detected on CT Images: From the Fleischner Society 2017; Radiology 2017; 284:228-243. 4. Mild subcutaneous edema in the anterior abdominal wall likely secondary to surgery. Electronically Signed   By: Greig Pique M.D.   On: 05/08/2024 18:43   DG Chest 1 View Result Date: 05/08/2024 EXAM: 1 VIEW(S) XRAY OF THE CHEST 05/08/2024 04:56:16 PM COMPARISON: None available. CLINICAL HISTORY: 355200 Chest pain [phone] FINDINGS: LUNGS AND PLEURA:  No focal pulmonary opacity. No pleural effusion. No pneumothorax. HEART AND MEDIASTINUM: No acute abnormality of the cardiac and mediastinal silhouettes. BONES AND SOFT TISSUES: No acute osseous abnormality. IMPRESSION: 1. No acute process. Electronically signed by: Lynwood Seip MD 05/08/2024 05:05 PM EST RP Workstation: [id number]     Procedures   Medications Ordered in the ED  acetaminophen  (TYLENOL ) tablet 650 mg (has no administration in time range)  iohexol  (OMNIPAQUE ) 350 MG/ML injection 75 mL (75 mLs Intravenous Contrast Given 05/08/24 1817)                                    Medical Decision Making Risk OTC drugs.   This patient presents to the ED for concern of chest pain, this involves an extensive number of treatment options, and is a complaint that carries with it a high risk of complications and morbidity.  The differential diagnosis includes acute coronary syndrome, congestive heart failure, pericarditis, pneumonia, pulmonary embolism, tension pneumothorax, esophageal rupture, aortic dissection, cardiac tamponade, musculoskeletal   Co morbidities that complicate the patient evaluation  Anemia, recent hysterectomy   Additional history obtained:  No PCP listed in chart   Problem List / ED Course / Critical interventions / Medication management  Patient presented for left-sided chest discomfort that increases with deep inspirations.  Symptoms have been constant for the past 2 days.  Physical exam reassuring.  Patient afebrile with stable vitals. I Ordered, and personally interpreted labs.  Troponin within normal limits.  CBC without leukocytosis.  There is mild anemia with hemoglobin at 10.4.  BMP reassuring.  CBG mildly low at 67.  hCG negative.  D-dimer elevated at 0.82. The patient was maintained on a cardiac monitor.  I personally viewed and interpreted the EKG/cardiac monitored which showed an underlying rhythm of: Sinus rhythm. I ordered imaging studies including  chest xray, CTA chest, CT abdomen/pelvis to assess for process contributing to patient's symptoms. I independently visualized and interpreted imaging which showed 9 mm pulmonary nodule and benign s/p surgical changes. I agree with the radiologist interpretation. Shared all results with patient.  Answered all questions.  Patient believes that her symptoms are probably due to her recent surgery and the gas that was left over.  Patient stated that she is ready to go home. I have reviewed the patients home medicines and have made adjustments as needed The patient has been appropriately medically screened and/or stabilized in the ED. I have low suspicion for any other emergent medical condition which would require further screening, evaluation or treatment in the ED or require inpatient management. At time of discharge the patient is hemodynamically stable and in no acute distress. I have discussed work-up results and diagnosis with patient and answered all questions. Patient is agreeable with discharge plan. We discussed strict return precautions for returning to the emergency department  and they verbalized understanding.    Social Determinants of Health:  none      Final diagnoses:  Left chest pressure    ED Discharge Orders     None          Hoy Nidia FALCON, [place] 05/09/24 0505    Lorette Mayo, MD 05/09/24 [phone]

## 2024-05-14 ENCOUNTER — Ambulatory Visit: Admitting: Obstetrics & Gynecology

## 2024-05-14 ENCOUNTER — Encounter: Payer: Self-pay | Admitting: Obstetrics & Gynecology

## 2024-05-14 VITALS — BP 123/84 | HR 68 | Ht 61.0 in | Wt 204.8 lb

## 2024-05-14 DIAGNOSIS — Z4889 Encounter for other specified surgical aftercare: Secondary | ICD-10-CM

## 2024-05-14 NOTE — Progress Notes (Signed)
    PostOp Visit Note  Joanna Hull is a 33 y.o. G0P0000 female who presents for a postoperative visit. She is 2 weeks postop following a RAH, BS completed on 11/12/  Today she notes that she has been doing well.  Denies fever or chills.  Tolerating gen diet.  +Flatus, Some constipation, but has had BMs since surgery.   Pain is well controlled.  She has had a small amount of spotting from her LUQ site, but minimal.  Overall doing well and reports no acute complaints   Review of Systems Pertinent items are noted in HPI.    Objective:  BP 123/84 (BP Location: Right Arm, Patient Position: Sitting, Cuff Size: Normal)   Pulse 68   Ht 5' 1 (1.549 m)   Wt 204 lb 12.8 oz (92.9 kg)   LMP 03/05/2024 (Exact Date)   BMI 38.70 kg/m    Physical Examination:  GENERAL ASSESSMENT: well developed and well nourished SKIN: normal color, no lesions CHEST: normal air exchange, respiratory effort normal with no retractions HEART: regular rate and rhythm ABDOMEN: soft, non-distended, +BS INCISION: C/D/I with dermabond.  No drainage or evidence of infection noted EXTREMITY: no calf tenderness bilaterally PSYCH: mood appropriate, normal affect       Assessment:    Postop   Plan:   - Meeting milestones appropriately - Reviewed pelvic rest for an additional 6 weeks - Follow-up for 8-week postop visit  Katha Kuehne, DO Attending Obstetrician & Gynecologist, [facility] [employer], [facility]

## 2024-05-29 ENCOUNTER — Inpatient Hospital Stay: Attending: Adult Health

## 2024-05-31 ENCOUNTER — Encounter: Payer: Self-pay | Admitting: Adult Health

## 2024-05-31 ENCOUNTER — Inpatient Hospital Stay: Attending: Adult Health | Admitting: Adult Health

## 2024-05-31 DIAGNOSIS — D500 Iron deficiency anemia secondary to blood loss (chronic): Secondary | ICD-10-CM

## 2024-05-31 NOTE — Progress Notes (Unsigned)
 [facility] Cancer Follow up:    Pcp, No No address on file  I connected with [name] on 06/04/2024 at 11:40 AM EST by telephone and verified that I am speaking with the correct person using two identifiers. I discussed the limitations, risks, security and privacy concerns of performing an evaluation and management service by telephone and the availability of in person appointments. I also discussed with the patient that there may be a patient responsible charge related to this service. The patient expressed understanding and agreed to proceed.   Patient location: home Provider location:  [facility] office Others participating in call: none  DIAGNOSIS: iron  deficiency anemia   SUMMARY OF ONCOLOGIC HISTORY: Referred to Dr. Loretha for iron  deficiency anemia related to heavy menstrual cycles.   IV iron  with Venofer  every 2 days x 5 in 05/2022 Oral iron  daily (stopped oral iron ) IV iron  200mg  x 5 beginning 03/30/2024 Following with gyn on Progesterone; Hysterectomy on 11/12  CURRENT THERAPY: observation  INTERVAL HISTORY:  Discussed the use of AI scribe software for clinical note transcription with the patient, who gave verbal consent to proceed.  History of Present Illness Joanna Hull is a 33 year old female with iron  deficiency anemia secondary to chronic blood loss who presents for hematology/oncology follow-up to assess recovery and ongoing management of anemia.  She is recovering from total hysterectomy with removal of cervix on 05/02/2024. She has no further vaginal bleeding or menses and feels well without postoperative complications.  Before surgery she received iron  infusions for severe iron  deficiency anemia with ferritin of 6. Her hemoglobin was 10.4 on 05/08/2024 after surgery. She has not resumed oral iron  and denies fatigue, weakness, or other anemia symptoms.  She has remote abnormal Pap smears but normal results on the last three. She asked about future Pap  screening after cervix removal and was advised to confirm recommendations with her surgeon.  She has no new symptoms or concerns today.     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  MEDICAL HISTORY: Past Medical History:  Diagnosis Date   Anemia     SURGICAL HISTORY: Past Surgical History:  Procedure Laterality Date   HYSTERECTOMY,TOTAL,BILAT SALPINGO-OOPHORECTOMY, ROBOT, LAP N/A 05/02/2024   Procedure: HYSTERECTOMY, TOTAL, BILATERAL SAPLINGECTOMY, ROBOT ASSISTED, LAPAROSCOPIC, D5;  Surgeon: Jayne Vonn DEL, MD;  Location: [facility];  Service: Gynecology;  Laterality: N/A;   NO PAST SURGERIES      SOCIAL HISTORY: Social History   Socioeconomic History   Marital status: Single    Spouse name: Not on file   Number of children: Not on file   Years of education: Not on file   Highest education level: Not on file  Occupational History   Not on file  Tobacco Use   Smoking status: Never   Smokeless tobacco: Never  Vaping Use   Vaping status: Never Used  Substance and Sexual Activity   Alcohol use: Never   Drug use: Never   Sexual activity: Yes    Birth control/protection: None  Other Topics Concern   Not on file  Social History Narrative   Not on file   Social Drivers of Health   Tobacco Use: Low Risk (05/31/2024)   Patient History    Smoking Tobacco Use: Never  Smokeless Tobacco Use: Never    Passive Exposure: Not on file  Financial Resource Strain: Not on file  Food Insecurity: Not on file  Transportation Needs: Not on file  Physical Activity: Not on file  Stress: Not on file  Social Connections: Not on file  Intimate Partner Violence: Not on file  Depression (PHQ2-9): Low Risk (09/06/2022)   Depression (PHQ2-9)    PHQ-2 Score: 0   Alcohol Screen: Not on file  Housing: Not on file  Utilities: Not on file  Health Literacy: Not on file    FAMILY HISTORY: Family History  Problem Relation Age of Onset   Colon cancer Father    Stroke Father    Stroke Mother    Diabetes Mother    Hypertension Mother     Review of Systems  Constitutional:  Negative for appetite change, chills, fatigue, fever and unexpected weight change.  HENT:   Negative for hearing loss, lump/mass and trouble swallowing.   Eyes:  Negative for eye problems and icterus.  Respiratory:  Negative for chest tightness, cough and shortness of breath.   Cardiovascular:  Negative for chest pain, leg swelling and palpitations.  Gastrointestinal:  Negative for abdominal distention, abdominal pain, constipation, diarrhea, nausea and vomiting.  Endocrine: Negative for hot flashes.  Genitourinary:  Negative for difficulty urinating.   Musculoskeletal:  Negative for arthralgias.  Skin:  Negative for itching and rash.  Neurological:  Negative for dizziness, extremity weakness, headaches and numbness.  Hematological:  Negative for adenopathy. Does not bruise/bleed easily.  Psychiatric/Behavioral:  Negative for depression. The patient is not nervous/anxious.       PHYSICAL EXAMINATION  Patient sounds well.  She is in no apparent distress.  Mood and behavior are normal.  Speech is normal. Breathing is non-labored.    LABORATORY DATA:  CBC    Component Value Date/Time   WBC 5.8 05/08/2024 1543   RBC 4.35 05/08/2024 1543   HGB 10.4 (L) 05/08/2024 1543   HGB 9.3 (L) 03/02/2024 1307   HGB 11.4 09/06/2022 1032   HCT 35.2 (L) 05/08/2024 1543   HCT 35.3 09/06/2022 1032   PLT 390 05/08/2024 1543   PLT 408 (H) 03/02/2024 1307   PLT 338 09/06/2022 1032   MCV 80.9 05/08/2024 1543   MCV 83 09/06/2022 1032   MCH 23.9 (L) 05/08/2024 1543   MCHC 29.5 (L) 05/08/2024 1543   RDW Not Measured 05/08/2024 1543   RDW 14.8 09/06/2022 1032   LYMPHSABS 1.4  04/30/2024 0954   LYMPHSABS 1.8 09/06/2022 1032   MONOABS 0.3 04/30/2024 0954   EOSABS 0.1 04/30/2024 0954   EOSABS 0.2 09/06/2022 1032   BASOSABS 0.0 04/30/2024 0954   BASOSABS 0.0 09/06/2022 1032       ASSESSMENT and THERAPY PLAN:   Assessment and Plan Assessment & Plan Iron  deficiency anemia due to chronic blood loss Chronic blood loss status post-hysterectomy. Iron  status to be reassessed for supplementation need. - Repeat iron  studies in four weeks. - Lab-only visit in four weeks. - Follow-up phone call in four weeks and two days to review results and discuss management.  Acquired absence of uterus (post-hysterectomy) Post-hysterectomy recovery proceeding well. Surgical pathology normal. Future cervical cancer screening requirements need clarification with gynecology due to prior abnormal Pap smears. - Reviewed normal surgical pathology of uterus and cervix. - Discussed future Pap smear needs; advised confirmation of screening timeline with surgical team.   The patient was provided an opportunity to ask questions and all were answered. The patient  agreed with the plan and demonstrated an understanding of the instructions.   The patient was advised to call back or seek an in-person evaluation if the symptoms worsen or if the condition fails to improve as anticipated.   I provided 10 minutes of non face-to-face telephone visit time during this encounter, and > 50% was spent counseling as documented under my assessment & plan.   Morna Kendall, NP 05/31/2024 11:57 AM Medical Oncology and Hematology [facility] [address] [place], [place] [address] Tel. [phone]    Fax. [phone]  *Total Encounter Time as defined by the Centers for Medicare and Medicaid Services includes, in addition to the face-to-face time of a patient visit (documented in the note above) non-face-to-face time: obtaining and reviewing outside history, ordering and reviewing  medications, tests or procedures, care coordination (communications with other health care professionals or caregivers) and documentation in the medical record.

## 2024-06-25 ENCOUNTER — Encounter: Payer: Self-pay | Admitting: Obstetrics & Gynecology

## 2024-06-25 ENCOUNTER — Encounter: Admitting: Obstetrics & Gynecology

## 2024-06-25 VITALS — BP 143/89 | Ht 62.0 in | Wt 217.5 lb

## 2024-06-25 DIAGNOSIS — Z4889 Encounter for other specified surgical aftercare: Secondary | ICD-10-CM

## 2024-06-25 DIAGNOSIS — Z48816 Encounter for surgical aftercare following surgery on the genitourinary system: Secondary | ICD-10-CM

## 2024-06-25 NOTE — Progress Notes (Signed)
" °  HPI: Patient returns for routine postoperative follow-up having undergone     ICD-10-CM   1. Postoperative visit: RA [facility] BS 05/02/24  Z48.89         The patient's immediate postoperative recovery has been unremarkable. Since hospital discharge the patient reports no problems.   Current Outpatient Medications: cholecalciferol (VITAMIN D3) 25 MCG (1000 UNIT) tablet, Take 1,000 Units by mouth once a week. (Patient not taking: Reported on 06/25/2024), Disp: , Rfl:  ciprofloxacin  (CIPRO ) 500 MG tablet, Take 1 tablet (500 mg total) by mouth 2 (two) times daily. (Patient not taking: Reported on 06/25/2024), Disp: 14 tablet, Rfl: 0 ketorolac  (TORADOL ) 10 MG tablet, Take 1 tablet (10 mg total) by mouth every 8 (eight) hours as needed. (Patient not taking: Reported on 06/25/2024), Disp: 15 tablet, Rfl: 0 ondansetron  (ZOFRAN -ODT) 8 MG disintegrating tablet, Take 1 tablet (8 mg total) by mouth every 8 (eight) hours as needed for nausea or vomiting. (Patient not taking: Reported on 06/25/2024), Disp: 8 tablet, Rfl: 0 oxyCODONE -acetaminophen  (PERCOCET) 7.5-325 MG tablet, Take 1 tablet by mouth every 6 (six) hours as needed. (Patient not taking: Reported on 06/25/2024), Disp: 28 tablet, Rfl: 0  No current facility-administered medications for this visit.    Blood pressure (!) 143/89, height 5' 2 (1.575 m), weight 217 lb 8 oz (98.7 kg), last menstrual period 03/05/2024.  Physical Exam: 5 incisions all look good Cuff intact and well healed  Diagnostic Tests:   Pathology: Benign fibroids  Impression + Management plan:   ICD-10-CM   1. Postoperative visit: RA [facility] BS 05/02/24  Z48.89           Medications Prescribed this encounter: No orders of the defined types were placed in this encounter.     Follow up: Prn    Vonn VEAR Inch, MD Attending Physician for the [facility] 06/25/2024 10:00 AM     "

## 2024-06-28 ENCOUNTER — Inpatient Hospital Stay: Attending: Adult Health

## 2024-06-28 DIAGNOSIS — Z90710 Acquired absence of both cervix and uterus: Secondary | ICD-10-CM | POA: Insufficient documentation

## 2024-06-28 DIAGNOSIS — N920 Excessive and frequent menstruation with regular cycle: Secondary | ICD-10-CM | POA: Diagnosis not present

## 2024-06-28 DIAGNOSIS — D509 Iron deficiency anemia, unspecified: Secondary | ICD-10-CM | POA: Insufficient documentation

## 2024-06-28 DIAGNOSIS — Z800 Family history of malignant neoplasm of digestive organs: Secondary | ICD-10-CM | POA: Insufficient documentation

## 2024-06-28 DIAGNOSIS — D500 Iron deficiency anemia secondary to blood loss (chronic): Secondary | ICD-10-CM

## 2024-06-28 DIAGNOSIS — Z86018 Personal history of other benign neoplasm: Secondary | ICD-10-CM | POA: Insufficient documentation

## 2024-06-28 LAB — CMP (CANCER CENTER ONLY)
ALT: 12 U/L (ref 0–44)
AST: 21 U/L (ref 15–41)
Albumin: 4.6 g/dL (ref 3.5–5.0)
Alkaline Phosphatase: 77 U/L (ref 38–126)
Anion gap: 13 (ref 5–15)
BUN: 10 mg/dL (ref 6–20)
CO2: 22 mmol/L (ref 22–32)
Calcium: 9.6 mg/dL (ref 8.9–10.3)
Chloride: 98 mmol/L (ref 98–111)
Creatinine: 0.71 mg/dL (ref 0.44–1.00)
GFR, Estimated: >60 mL/min
Glucose, Bld: 69 mg/dL — ABNORMAL LOW (ref 70–99)
Potassium: 4.3 mmol/L (ref 3.5–5.1)
Sodium: 133 mmol/L — ABNORMAL LOW (ref 135–145)
Total Bilirubin: 0.4 mg/dL (ref 0.0–1.2)
Total Protein: 8.7 g/dL — ABNORMAL HIGH (ref 6.5–8.1)

## 2024-06-28 LAB — IRON AND IRON BINDING CAPACITY (CC-WL,HP ONLY)
Iron: 53 ug/dL (ref 28–170)
Saturation Ratios: 14 % (ref 10.4–31.8)
TIBC: 382 ug/dL (ref 250–450)
UIBC: 329 ug/dL

## 2024-06-28 LAB — CBC WITH DIFFERENTIAL (CANCER CENTER ONLY)
Abs Immature Granulocytes: 0.03 K/uL (ref 0.00–0.07)
Basophils Absolute: 0 K/uL (ref 0.0–0.1)
Basophils Relative: 0 %
Eosinophils Absolute: 0.1 K/uL (ref 0.0–0.5)
Eosinophils Relative: 1 %
HCT: 40.3 % (ref 36.0–46.0)
Hemoglobin: 12.7 g/dL (ref 12.0–15.0)
Immature Granulocytes: 0 %
Lymphocytes Relative: 18 %
Lymphs Abs: 1.3 K/uL (ref 0.7–4.0)
MCH: 24.2 pg — ABNORMAL LOW (ref 26.0–34.0)
MCHC: 31.5 g/dL (ref 30.0–36.0)
MCV: 76.9 fL — ABNORMAL LOW (ref 80.0–100.0)
Monocytes Absolute: 0.3 K/uL (ref 0.1–1.0)
Monocytes Relative: 4 %
Neutro Abs: 5.5 K/uL (ref 1.7–7.7)
Neutrophils Relative %: 77 %
Platelet Count: 282 K/uL (ref 150–400)
RBC: 5.24 MIL/uL — ABNORMAL HIGH (ref 3.87–5.11)
RDW: 17.8 % — ABNORMAL HIGH (ref 11.5–15.5)
WBC Count: 7.3 K/uL (ref 4.0–10.5)
nRBC: 0 % (ref 0.0–0.2)

## 2024-06-28 LAB — VITAMIN B12: Vitamin B-12: 652 pg/mL (ref 180–914)

## 2024-06-28 LAB — FERRITIN: Ferritin: 20 ng/mL (ref 11–307)

## 2024-06-29 ENCOUNTER — Inpatient Hospital Stay

## 2024-07-02 ENCOUNTER — Inpatient Hospital Stay: Admitting: Adult Health

## 2024-07-02 DIAGNOSIS — D500 Iron deficiency anemia secondary to blood loss (chronic): Secondary | ICD-10-CM | POA: Diagnosis not present

## 2024-07-02 NOTE — Progress Notes (Signed)
 [facility] Cancer Follow up:    Pcp, No No address on file   DIAGNOSIS: iron  deficiency anemia   SUMMARY OF HEMATOLOGIC HISTORY: Referred to Dr. Loretha for iron  deficiency anemia related to heavy menstrual cycles.   IV iron  with Venofer  every 2 days x 5 in 05/2022 Oral iron  daily (stopped oral iron ) IV iron  200mg  x 5 beginning 03/30/2024 Following with gyn on Progesterone; Hysterectomy on 11/12  CURRENT THERAPY:observation  INTERVAL HISTORY:  Discussed the use of AI scribe software for clinical note transcription with the patient, who gave verbal consent to proceed.  History of Present Illness Joanna Hull is a 34 year old female with iron  deficiency anemia who presents for routine hematology follow-up.  She underwent hysterectomy on 11/12 and before then received IV iron .  She has no current symptoms of anemia and notes marked improvement in energy, activity level, and ability to wake early without fatigue compared with prior baseline.  She denies bleeding, hematochezia, melena, fatigue, anxiety, or other symptoms concerning for recurrent anemia.  Recent labs including hemoglobin, ferritin, and iron  studies were normal.     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  MEDICAL HISTORY: Past Medical History:  Diagnosis Date   Anemia     SURGICAL HISTORY: Past Surgical History:  Procedure Laterality Date   HYSTERECTOMY,TOTAL,BILAT SALPINGO-OOPHORECTOMY, ROBOT, LAP N/A 05/02/2024   Procedure: HYSTERECTOMY, TOTAL, BILATERAL SAPLINGECTOMY, ROBOT ASSISTED, LAPAROSCOPIC, D5;  Surgeon: Jayne Vonn DEL, MD;  Location: [facility];  Service: Gynecology;  Laterality: N/A;   NO PAST SURGERIES      SOCIAL HISTORY: Social History    Socioeconomic History   Marital status: Single    Spouse name: Not on file   Number of children: Not on file   Years of education: Not on file   Highest education level: Not on file  Occupational History   Not on file  Tobacco Use   Smoking status: Never   Smokeless tobacco: Never  Vaping Use   Vaping status: Never Used  Substance and Sexual Activity   Alcohol use: Never   Drug use: Never   Sexual activity: Yes    Birth control/protection: Surgical  Other Topics Concern   Not on file  Social History Narrative   Not on file   Social Drivers of Health   Tobacco Use: Low Risk (06/25/2024)   Patient History    Smoking Tobacco Use: Never    Smokeless Tobacco Use: Never    Passive Exposure: Not on file  Financial Resource Strain: Not on file  Food Insecurity: Not on file  Transportation Needs: Not on file  Physical Activity: Not on file  Stress: Not on file  Social Connections: Not on file  Intimate Partner Violence: Not on file  Depression (PHQ2-9): Low Risk (09/06/2022)   Depression (PHQ2-9)    PHQ-2 Score: 0  Alcohol Screen: Not on file  Housing: Not on file  Utilities: Not on file  Health Literacy: Not on file    FAMILY HISTORY: Family History  Problem Relation Age of Onset   Colon cancer Father    Stroke Father    Stroke Mother    Diabetes Mother    Hypertension Mother     Review of Systems  Constitutional:  Negative for appetite  change, chills, fatigue, fever and unexpected weight change.  HENT:   Negative for hearing loss, lump/mass and trouble swallowing.   Eyes:  Negative for eye problems and icterus.  Respiratory:  Negative for chest tightness, cough and shortness of breath.   Cardiovascular:  Negative for chest pain, leg swelling and palpitations.  Gastrointestinal:  Negative for abdominal distention, abdominal pain, constipation, diarrhea, nausea and vomiting.  Endocrine: Negative for hot flashes.  Genitourinary:  Negative for difficulty  urinating.   Musculoskeletal:  Negative for arthralgias.  Skin:  Negative for itching and rash.  Neurological:  Negative for dizziness, extremity weakness, headaches and numbness.  Hematological:  Negative for adenopathy. Does not bruise/bleed easily.  Psychiatric/Behavioral:  Negative for depression. The patient is not nervous/anxious.       PHYSICAL EXAMINATION Patient sounds well.  No apparent distress, mood and behavior are normal, speech is normal, breathing is non labored.    LABORATORY DATA:  CBC    Component Value Date/Time   WBC 7.3 06/28/2024 1258   WBC 5.8 05/08/2024 1543   RBC 5.24 (H) 06/28/2024 1258   HGB 12.7 06/28/2024 1258   HGB 11.4 09/06/2022 1032   HCT 40.3 06/28/2024 1258   HCT 35.3 09/06/2022 1032   PLT 282 06/28/2024 1258   PLT 338 09/06/2022 1032   MCV 76.9 (L) 06/28/2024 1258   MCV 83 09/06/2022 1032   MCH 24.2 (L) 06/28/2024 1258   MCHC 31.5 06/28/2024 1258   RDW 17.8 (H) 06/28/2024 1258   RDW 14.8 09/06/2022 1032   LYMPHSABS 1.3 06/28/2024 1258   LYMPHSABS 1.8 09/06/2022 1032   MONOABS 0.3 06/28/2024 1258   EOSABS 0.1 06/28/2024 1258   EOSABS 0.2 09/06/2022 1032   BASOSABS 0.0 06/28/2024 1258   BASOSABS 0.0 09/06/2022 1032    CMP     Component Value Date/Time   NA 133 (L) 06/28/2024 1258   NA 139 09/06/2022 1032   K 4.3 06/28/2024 1258   CL 98 06/28/2024 1258   CO2 22 06/28/2024 1258   GLUCOSE 69 (L) 06/28/2024 1258   BUN 10 06/28/2024 1258   BUN 11 09/06/2022 1032   CREATININE 0.71 06/28/2024 1258   CALCIUM 9.6 06/28/2024 1258   PROT 8.7 (H) 06/28/2024 1258   PROT 6.9 09/06/2022 1032   ALBUMIN 4.6 06/28/2024 1258   ALBUMIN 3.9 09/06/2022 1032   AST 21 06/28/2024 1258   ALT 12 06/28/2024 1258   ALKPHOS 77 06/28/2024 1258   BILITOT 0.4 06/28/2024 1258   GFRNONAA >60 06/28/2024 1258     ASSESSMENT and THERAPY PLAN:   Assessment and Plan Assessment & Plan Iron  deficiency anemia Iron  deficiency anemia in remission with  normalized ferritin, iron  studies, and hemoglobin. Asymptomatic with improved energy and no signs of blood loss or anemia. S/p hysterectomy which dysfunctional uterine bleeding likely contributed to iron  loss.  - Annual lab evaluation to monitor for recurrence or changes. - Advised to contact clinic if symptoms like fatigue or anemia signs recur for early evaluation and testing. - Discussed availability of additional lab tests at cancer center with results forwarded to other providers as needed.  RTC in 1 year for lab and f/u.   The patient was provided an opportunity to ask questions and all were answered. The patient agreed with the plan and demonstrated an understanding of the instructions.   The patient was advised to call back or seek an in-person evaluation if the symptoms worsen or if the condition fails to improve as anticipated.  I provided 10 minutes of non face-to-face telephone visit time during this encounter, and > 50% was spent counseling as documented under my assessment & plan.    Morna Kendall, NP 07/02/2024 9:43 AM Medical Oncology and Hematology [facility] [address] [place], [place] [address] Tel. [phone]    Fax. [phone]  *Total Encounter Time as defined by the Centers for Medicare and Medicaid Services includes, in addition to the face-to-face time of a patient visit (documented in the note above) non-face-to-face time: obtaining and reviewing outside history, ordering and reviewing medications, tests or procedures, care coordination (communications with other health care professionals or caregivers) and documentation in the medical record.

## 2024-08-14 ENCOUNTER — Encounter (HOSPITAL_BASED_OUTPATIENT_CLINIC_OR_DEPARTMENT_OTHER)

## 2025-07-03 ENCOUNTER — Inpatient Hospital Stay

## 2025-07-05 ENCOUNTER — Inpatient Hospital Stay: Admitting: Adult Health
